# Patient Record
Sex: Female | Born: 1950 | Race: White | Hispanic: No | Marital: Single | Smoking: Current every day smoker
Health system: Southern US, Community
[De-identification: ages and names within clinical notes are randomized; demographics above are authoritative.]

## PROBLEM LIST (undated history)

## (undated) DIAGNOSIS — E039 Hypothyroidism, unspecified: Secondary | ICD-10-CM

## (undated) DIAGNOSIS — J45909 Unspecified asthma, uncomplicated: Secondary | ICD-10-CM

## (undated) HISTORY — DX: Hypothyroidism, unspecified: E03.9

## (undated) HISTORY — PX: CHOLECYSTECTOMY: SHX55

## (undated) HISTORY — DX: Unspecified asthma, uncomplicated: J45.909

## (undated) HISTORY — PX: BREAST REDUCTION SURGERY: SHX8

---

## 1999-03-29 ENCOUNTER — Other Ambulatory Visit: Admission: RE | Admit: 1999-03-29 | Discharge: 1999-03-29 | Payer: Self-pay | Admitting: Obstetrics and Gynecology

## 2000-09-29 ENCOUNTER — Ambulatory Visit (HOSPITAL_COMMUNITY): Admission: RE | Admit: 2000-09-29 | Discharge: 2000-09-29 | Payer: Self-pay | Admitting: Family Medicine

## 2000-09-29 ENCOUNTER — Encounter: Payer: Self-pay | Admitting: Family Medicine

## 2005-10-12 ENCOUNTER — Ambulatory Visit (HOSPITAL_COMMUNITY): Admission: RE | Admit: 2005-10-12 | Discharge: 2005-10-12 | Payer: Self-pay | Admitting: Family Medicine

## 2006-02-20 ENCOUNTER — Ambulatory Visit (HOSPITAL_COMMUNITY): Admission: RE | Admit: 2006-02-20 | Discharge: 2006-02-20 | Payer: Self-pay | Admitting: Family Medicine

## 2006-02-28 ENCOUNTER — Ambulatory Visit (HOSPITAL_COMMUNITY): Admission: RE | Admit: 2006-02-28 | Discharge: 2006-02-28 | Payer: Self-pay | Admitting: Family Medicine

## 2006-03-08 ENCOUNTER — Encounter: Admission: RE | Admit: 2006-03-08 | Discharge: 2006-05-12 | Payer: Self-pay | Admitting: Neurosurgery

## 2006-07-22 ENCOUNTER — Ambulatory Visit (HOSPITAL_COMMUNITY): Admission: RE | Admit: 2006-07-22 | Discharge: 2006-07-22 | Payer: Self-pay | Admitting: Interventional Radiology

## 2007-05-15 ENCOUNTER — Ambulatory Visit (HOSPITAL_COMMUNITY): Admission: RE | Admit: 2007-05-15 | Discharge: 2007-05-15 | Payer: Self-pay | Admitting: Endocrinology

## 2008-06-09 ENCOUNTER — Ambulatory Visit (HOSPITAL_COMMUNITY): Admission: RE | Admit: 2008-06-09 | Discharge: 2008-06-09 | Payer: Self-pay | Admitting: Endocrinology

## 2008-07-24 ENCOUNTER — Encounter
Admission: RE | Admit: 2008-07-24 | Discharge: 2008-07-25 | Payer: Self-pay | Admitting: Physical Medicine & Rehabilitation

## 2008-07-25 ENCOUNTER — Ambulatory Visit: Payer: Self-pay | Admitting: Physical Medicine & Rehabilitation

## 2008-08-22 ENCOUNTER — Ambulatory Visit: Payer: Self-pay | Admitting: Physical Medicine & Rehabilitation

## 2008-08-22 ENCOUNTER — Encounter
Admission: RE | Admit: 2008-08-22 | Discharge: 2008-11-20 | Payer: Self-pay | Admitting: Physical Medicine & Rehabilitation

## 2008-10-03 ENCOUNTER — Ambulatory Visit: Payer: Self-pay | Admitting: Physical Medicine & Rehabilitation

## 2008-11-04 ENCOUNTER — Ambulatory Visit: Payer: Self-pay | Admitting: Physical Medicine & Rehabilitation

## 2008-12-02 ENCOUNTER — Encounter
Admission: RE | Admit: 2008-12-02 | Discharge: 2009-01-28 | Payer: Self-pay | Admitting: Physical Medicine & Rehabilitation

## 2008-12-03 ENCOUNTER — Ambulatory Visit: Payer: Self-pay | Admitting: Physical Medicine & Rehabilitation

## 2008-12-29 ENCOUNTER — Ambulatory Visit: Payer: Self-pay | Admitting: Physical Medicine & Rehabilitation

## 2009-01-28 ENCOUNTER — Ambulatory Visit: Payer: Self-pay | Admitting: Physical Medicine & Rehabilitation

## 2009-04-23 ENCOUNTER — Ambulatory Visit (HOSPITAL_COMMUNITY): Admission: RE | Admit: 2009-04-23 | Discharge: 2009-04-23 | Payer: Self-pay | Admitting: Unknown Physician Specialty

## 2010-06-22 NOTE — Consult Note (Signed)
[name], [name]              ACCOUNT NO.:  [id number]   MEDICAL RECORD NO.:  [id number]          PATIENT TYPE:  OUT   LOCATION:  XRAY                         FACILITY:  [facility]   PHYSICIAN:  Sanjeev K. Deveshwar, M.D.DATE OF BIRTH:  1950-06-08   DATE OF CONSULTATION:  07/18/2006  DATE OF DISCHARGE:                                 CONSULTATION   ADDENDUM:  Dr. Corliss Skains also discussed other treatment options such as  epidural injections and physical therapy depending on the results of the  MRI.  He also recommended that the patient try to quit smoking.      Delton See, P.A.    ______________________________  Grand[place] Silos. Corliss Skains, M.D.    DR/MEDQ  D:  07/18/2006  T:  07/19/2006  Job:  [id number]   cc:   Dario Guardian, M.D.  Dorisann Frames, M.D.

## 2010-06-22 NOTE — Assessment & Plan Note (Signed)
A 60 year old female with lumbar pain likely facet arthropathy.  She was  last seen by me August 22, 2008.  She has been maintained on oxycodone 5  mg t.i.d.  She has been started on trazodone 50 nightly.  She is at a  new job.  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  Her average pain is 4-  5/10, interferes with activity at a 3/10.  Sleep is good.  Relief from  meds is good.  She can climb steps.  She drives.  She works 40 hours a  week at [employer].   REVIEW OF SYSTEMS:  Positive for tingling and spasms.   PHYSICAL EXAMINATION:  VITAL SIGNS:  Blood pressure 140/83, pulse 93,  respirations 18, O2 sat 99% on room air.  GENERAL:  No acute distress.  Orientation x3.  Affect is bright.  Gait  is normal.  BACK:  Has mild tenderness to palpation lumbosacral junction.  She has  pain with extension greater than with flexion.  Lower extremity strength  is normal.  Lower extremity range of motion is normal.  Deep tendon  reflexes are normal.   IMPRESSION:  Lumbar pain, likely spondylosis related.   PLAN:  We will continue on current medications.  She is doing well quite  functional with this.  She has gone through physical therapy with  aquatic therapy continues on her own at community based aquatic program.  I will see her back in about 3 months, nursing visit in 1 month.      Erick Colace, M.D.  Electronically Signed     AEK/MedQ  D:  10/03/2008 17:02:13  T:  10/04/2008 06:14:35  Job #:  [id number]

## 2010-06-22 NOTE — Consult Note (Signed)
A 60 year old female who has a history of osteoporosis had an acute L5  fracture in 2008, and prior to that, had an L1 fracture, date  undetermined.  There is a consultation I reviewed from Dr. Corliss Skains  dated July 18, 2006, stating that she fell on some ice in the early  1990s and developed back pain since that time.  That may have been her  T2 and L2 fractures on the endplates.  Her last MRI was July 22, 2006,  superior endplate deformity L5 but was really not felt to look acute and  superior endplate compression L2.  She was not scanned in the thoracic  region at that time.  More significantly, she had facet degenerative  changes greatest at right L3-4 but also on my review of the films  present at L5-S1 and L4-5 bilaterally.   Her pain interferes with activity at 5-6/10 despite that she is working  as a Engineer, civil (consulting), recently switched from the [employer] to a more desk job  at [employer] 40 hours a week.  Stable climb steps.  She is able  to drive.  Pain is worse with walking, bending, standing, improves with  rest and medications.  Relief from meds is good.   PAST HISTORY:  Hypothyroidism.  Sees endocrinologist, Dr. Talmage Nap for both  this and osteoporosis.  She has been using Forteo to boost her bone  density.   PAST SURGICAL HISTORY:  Breast reduction in 1980s; gallbladder surgery  in 1980; ovarian cyst, right side.   SOCIAL HISTORY:  Single, lives with her boyfriend, smokes a few  cigarettes per day down from a half pack per day in 2008.  Occasional  glass of wine.   FAMILY HISTORY:  Heart disease, high blood pressure.   PHYSICAL EXAMINATION:  VITAL SIGNS:  Blood pressure 153/76, pulse 98,  respirations 18 and O2 sat 94% room air.  GENERAL:  Well-developed, well-nourished female in no acute stress.  Orientation x3.  Affect alert.  Gait is normal.  EXTREMITIES:  Without edema.  Coordination normal in the upper and lower  extremities.  Deep tendon reflexes are normal in  the upper and lower  extremities.  Her lumbar spine range of motion is 75% in forward  flexion/extension, lateral rotation and bending.  She has pain more on  forward flexion at end range.   Gait is normal.  She can toe walk, heel walk.  She has mild tenderness  to palpation in lumbar paraspinal starting at L4 down to the PSIS.   IMPRESSION:  Lumbar pain.  I suspect she may have lumbar spondylosis as  a cause of her pain rather than the compression fractures which are  essentially healed.   RECOMMENDATIONS:  The lumbar medial branch blocks to further assess. We  will get these scheduled in the next couple of weeks.   Looking at her medications, she does pretty well with oxycodone.  She is  concerned about having to escalate on this at some point. She is in  favor of multimodal approach which I also have explained to her.   We will start her on some physical therapy, aquatic exercise and some  land-based therapy as well with precaution of osteoporosis. No excessive  flexion.   Thank you for this interesting consultation.  We will keep you apprised  of her progress.      Erick Colace, M.D.  Electronically Signed     AEK/MedQ  D:07/25/2008 16:48:36  T:07/26/2008 09:22:35  Job #:  [id number]   cc:   Dario Guardian, M.D.  Fax: [phone]

## 2010-06-22 NOTE — Consult Note (Signed)
NAMEAIREAL, SLATER              ACCOUNT NO.:  [id number]   MEDICAL RECORD NO.:  [id number]          PATIENT TYPE:  OUT   LOCATION:  XRAY                         FACILITY:  [facility]   PHYSICIAN:  Sanjeev K. Deveshwar, M.D.DATE OF BIRTH:  1950/09/01   DATE OF CONSULTATION:  07/18/2006  DATE OF DISCHARGE:                                 CONSULTATION   CHIEF COMPLAINT:  Back pain.   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  She was referred  to Dr. Corliss Skains through the courtesy of Dr. Talmage Nap, her endocrinologist,  who is currently treating her for osteoporosis.  The patient fell on the  ice in the early 1990s.  She developed back pain at that time.  She  recently had an increase in her back pain in January of this year.  An  MRI performed February 20, 2006, showed chronic endplate fractures at T2  and L2 with an acute or subacute endplate fracture at L5.   The patient reports that she has been having pain anywhere between a 6  on a 1-10 scale to an 8 or 9 on a 1-10 scale.  She states that at times  her pain brings her to tears despite taking Vicodin b.i.d.  The patient  is usually quite active.  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  She previously  worked out on a regular basis including lifting weights and exercises.  She has been significantly limited at this point due to her pain.  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  She  presents today to be seen by Dr. Corliss Skains for further evaluation.   PAST MEDICAL HISTORY:  1. 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  2. She has history of osteoporosis.  3. Apparently she is being worked up for an abnormal thyroid.  4. She has a history of degenerative cervical disk disease.  5. She reports an old abnormal EKG but a normal stress  test.      Otherwise she has been very healthy.   PAST SURGICAL HISTORY:  significant for cholecystectomy and a right  oophorectomy.  She reports nausea and vomiting with anesthesia.   ALLERGIES:  Include ERYTHROMYCIN and CONTRAST.  She developed a rash and  chest heaviness with CONTRAST DYE in the past,  although she had no  problems with contrast for a CT scan.   CURRENT MEDICATIONS:  Vicodin b.i.d. Bio-identical hormones, vitamin D,  calcium, Forteo, and estriol and estradiol hormone replacement therapy.   SOCIAL HISTORY:  The patient is divorced.  She has no children.  [place]  lives [place]  She has a 10-year history of  tobacco use, smoking up to one pack per day.  She drinks an occasional  glass of wine but this is rare.  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   FAMILY HISTORY:  Her mother is alive at age 60, she has hypertension.  Her father died at age 65, the exact cause is unknown.   IMPRESSION AND PLAN:  As noted, the patient presents today for further  evaluation of back pain.  Dr. Corliss Skains reviewed the results of her MRI  performed in January of this year.  He noted the acute compression  fracture at the L5 level as well as the chronic healed compression  fracture at L2.  The MRI only included the lumbar sacral spine.  Due to  the severity of the pain, Dr. Corliss Skains felt that a repeat MRI was  indicated as this MRI was about six months old.  The decision has been  made to proceed with a T8-S1 MRI with contrast for further evaluation of  her symptoms.  As noted, the patient also has degenerative disk disease.  Dr. Corliss Skains also felt that there was a possibility of a facette  problem causing her pain as well.   The kyphoplasty and vertebroplasty procedures were described in detail  along with the risks and benefits. The patient would like to proceed   with the kyphoplasty if felt to be indicated in her situation.  We will  know more once we have the results of the MRI.  If she requires a  kyphoplasty, we will give her a 13-hour prednisone prophylaxis due to  the possibility of balloon rupture and exposure to contrast dye.  She  was given a prescription for the prednisone today.  We will await the  results of the MRI prior to making any further recommendations.   Greater than 40 minutes was spent on this consult.      Delton See, P.A.    ______________________________  Grandville Silos. Corliss Skains, M.D.    DR/MEDQ  D:  07/18/2006  T:  07/19/2006  Job:  [id number]   cc:   Dario Guardian, M.D.

## 2010-06-22 NOTE — Assessment & Plan Note (Signed)
Molly Myers is a 60 year old female originally scheduled for medial branch  block.  She has axial back pain.  She has a healed compression deformity  due to osteoporosis.  She does have facet degenerative changes, greatest  on L3-4, but also present at L4-5 and L5-S1.  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   Her insurance has changed and states that she has what is known as a  Water quality scientist.  She has a 4000 dollar deductible.   Average pain is 5-6/10, interferes with activity at 3-4/10 level.  Sleep  is poor.  Pain improves with therapy medications.  Relief from meds is  fair.  She can walk without assistance.  Climb steps.  She drives.  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   REVIEW OF SYSTEMS:  Tingling and spasms.   PHYSICAL EXAMINATION:  VITAL SIGNS:  Blood pressure 139/81, pulse 90,  respirations 16, and O2 sat 98% on room air.  GENERAL:  Well-developed and well-nourished female in no acute stress.  Orientation x3.  Affect is bright and alert.  Gait is normal.  EXTREMITIES:  Without edema.  Sensation is normal.  She has normal  strength in lower extremities.  Negative straight leg raising test.  She  has some pain in the low back with extension.   IMPRESSION:  1. Lumbar pain.  I believe this is mainly facet arthropathy.  She is      doing relatively well on oxycodone 5 mg t.i.d.  No signs of      aberrant drug behavior.  2. Sleep disturbance.  We will start trazodone 50 nightly.  We will      stay away from benzodiazepines.  3. We will monitor with urine drug screen.  See her back in about 2      months and nursing visit in 1 month.      Erick Colace, M.D.  Electronically Signed     AEK/MedQ  D:  08/22/2008 16:49:20  T:  08/23/2008 03:29:55  Job #:  [id number]

## 2010-08-04 ENCOUNTER — Other Ambulatory Visit (HOSPITAL_COMMUNITY): Payer: Self-pay | Admitting: Endocrinology

## 2010-08-05 ENCOUNTER — Other Ambulatory Visit (HOSPITAL_COMMUNITY): Payer: Self-pay | Admitting: Pain Medicine

## 2010-08-05 DIAGNOSIS — M542 Cervicalgia: Secondary | ICD-10-CM

## 2010-08-05 DIAGNOSIS — M47812 Spondylosis without myelopathy or radiculopathy, cervical region: Secondary | ICD-10-CM

## 2010-08-27 ENCOUNTER — Ambulatory Visit (HOSPITAL_COMMUNITY)
Admission: RE | Admit: 2010-08-27 | Discharge: 2010-08-27 | Disposition: A | Discharge status: Home / Self Care | Payer: 59 | Source: Ambulatory Visit | Attending: Pain Medicine | Admitting: Pain Medicine

## 2010-08-27 DIAGNOSIS — M542 Cervicalgia: Secondary | ICD-10-CM | POA: Insufficient documentation

## 2010-08-27 DIAGNOSIS — M47812 Spondylosis without myelopathy or radiculopathy, cervical region: Secondary | ICD-10-CM

## 2010-08-27 DIAGNOSIS — M5020 Other cervical disc displacement, unspecified cervical region: Secondary | ICD-10-CM | POA: Insufficient documentation

## 2010-08-27 DIAGNOSIS — G589 Mononeuropathy, unspecified: Secondary | ICD-10-CM | POA: Insufficient documentation

## 2010-08-27 DIAGNOSIS — M79609 Pain in unspecified limb: Secondary | ICD-10-CM | POA: Insufficient documentation

## 2010-09-03 ENCOUNTER — Ambulatory Visit (HOSPITAL_COMMUNITY)
Admission: RE | Admit: 2010-09-03 | Discharge: 2010-09-03 | Disposition: A | Discharge status: Home / Self Care | Payer: 59 | Source: Ambulatory Visit | Attending: Endocrinology | Admitting: Endocrinology

## 2010-09-03 ENCOUNTER — Other Ambulatory Visit (HOSPITAL_COMMUNITY): Payer: Self-pay | Admitting: Endocrinology

## 2010-09-03 DIAGNOSIS — M810 Age-related osteoporosis without current pathological fracture: Secondary | ICD-10-CM | POA: Insufficient documentation

## 2014-05-19 ENCOUNTER — Other Ambulatory Visit: Payer: Self-pay | Admitting: Endocrinology

## 2014-05-19 DIAGNOSIS — M810 Age-related osteoporosis without current pathological fracture: Secondary | ICD-10-CM

## 2014-05-22 ENCOUNTER — Other Ambulatory Visit: Payer: Self-pay

## 2015-02-26 ENCOUNTER — Other Ambulatory Visit: Payer: Self-pay | Admitting: Obstetrics and Gynecology

## 2015-02-26 DIAGNOSIS — R928 Other abnormal and inconclusive findings on diagnostic imaging of breast: Secondary | ICD-10-CM

## 2015-03-03 ENCOUNTER — Institutional Professional Consult (permissible substitution): Payer: Self-pay | Admitting: Pulmonary Disease

## 2015-03-06 ENCOUNTER — Ambulatory Visit
Admission: RE | Admit: 2015-03-06 | Discharge: 2015-03-06 | Disposition: A | Discharge status: Home / Self Care | Payer: 59 | Source: Ambulatory Visit | Attending: Obstetrics and Gynecology | Admitting: Obstetrics and Gynecology

## 2015-03-06 DIAGNOSIS — R928 Other abnormal and inconclusive findings on diagnostic imaging of breast: Secondary | ICD-10-CM

## 2015-04-20 ENCOUNTER — Ambulatory Visit (INDEPENDENT_AMBULATORY_CARE_PROVIDER_SITE_OTHER): Payer: 59 | Admitting: Pulmonary Disease

## 2015-04-20 ENCOUNTER — Other Ambulatory Visit (INDEPENDENT_AMBULATORY_CARE_PROVIDER_SITE_OTHER): Payer: 59

## 2015-04-20 ENCOUNTER — Encounter: Payer: Self-pay | Admitting: Pulmonary Disease

## 2015-04-20 VITALS — BP 128/82 | HR 91 | Ht 63.0 in | Wt 148.0 lb

## 2015-04-20 DIAGNOSIS — J4541 Moderate persistent asthma with (acute) exacerbation: Secondary | ICD-10-CM

## 2015-04-20 LAB — CBC WITH DIFFERENTIAL/PLATELET
BASOS ABS: 0.1 10*3/uL (ref 0.0–0.1)
Basophils Relative: 1 % (ref 0.0–3.0)
EOS ABS: 0.6 10*3/uL (ref 0.0–0.7)
Eosinophils Relative: 5.5 % — ABNORMAL HIGH (ref 0.0–5.0)
HEMATOCRIT: 45.9 % (ref 36.0–46.0)
HEMOGLOBIN: 15.9 g/dL — AB (ref 12.0–15.0)
LYMPHS PCT: 34.1 % (ref 12.0–46.0)
Lymphs Abs: 3.5 10*3/uL (ref 0.7–4.0)
MCHC: 34.6 g/dL (ref 30.0–36.0)
MCV: 86.4 fl (ref 78.0–100.0)
Monocytes Absolute: 0.7 10*3/uL (ref 0.1–1.0)
Monocytes Relative: 7.1 % (ref 3.0–12.0)
Neutro Abs: 5.4 10*3/uL (ref 1.4–7.7)
Neutrophils Relative %: 52.3 % (ref 43.0–77.0)
Platelets: 287 10*3/uL (ref 150.0–400.0)
RBC: 5.32 Mil/uL — AB (ref 3.87–5.11)
RDW: 13.4 % (ref 11.5–15.5)
WBC: 10.3 10*3/uL (ref 4.0–10.5)

## 2015-04-20 MED ORDER — FLUTICASONE-SALMETEROL 250-50 MCG/DOSE IN AEPB: 1.0000 | INHALATION_SPRAY | Freq: Two times a day (BID) | RESPIRATORY_TRACT | Status: DC

## 2015-04-20 NOTE — Patient Instructions (Addendum)
You will be started on Advair 250/50. You will be scheduled for lung function tests and blood work to check blood count with differential, IgE levels.  Return to clinic in 3 months.

## 2015-04-20 NOTE — Progress Notes (Signed)
Subjective:    Patient ID: Molly Myers, female    DOB: 10/18/1950, 65 y.o.   MRN: [id number]  HPI Consult for evaluation, management of asthma.  [name] is a 65 year old with past medical history as below. She has a diagnosis of asthma and has had multiple episodes over the past few years. She usually goes to urgent care and was given prednisone taper, nebs. She last had an eval in January. She had a chest x-ray at urgent care which is reportedly normal as per the patient. She is maintained on just albuterol rescue inhaler and nebulizer. She needs to use this up to 4 times a day. She also wakes up daily at night short of breath. She's not been on any long-term controller medication. She also has seasonal allergies, sensitivity to pollen. This is worse in spring. She is on a Flonase nasal spray. She does not have any sensitivity to cats, dogs, dust.  DATA: PFTs 01/06/15 FVC 1.87 (75%) FEV1 1.32 (63%) F/F 70 Moderate obstruction.  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 She smokes about half pack a. No alcohol, drug use.  Family history: Asthma-brother, father. Hypertension-mother  Past Medical History  Diagnosis Date  . Hypothyroidism   . Asthma      Current outpatient prescriptions:  .  albuterol (PROVENTIL HFA;VENTOLIN HFA) 108 (90 Base) MCG/ACT inhaler, Inhale 1-2 puffs into the lungs every 6 (six) hours as needed for wheezing or shortness of breath., Disp: , Rfl:  .  albuterol (PROVENTIL) (2.5 MG/3ML) 0.083% nebulizer solution, 1 vial in nebulizer every 6 hours as needed, Disp: , Rfl: 1 .  DHEA 10 MG TABS, Take 10 mg by mouth daily., Disp: , Rfl:  .  OPANA ER, CRUSH RESISTANT, 10 MG T12A 12 hr tablet, Take 10 mg by mouth every 12 (twelve) hours. For back pain, Disp: , Rfl: 0 .  Oxycodone HCl 10 MG TABS, Take 10 mg by mouth 4 (four) times daily as needed. For back pain, Disp: , Rfl: 0 .  thyroid (ARMOUR) 60 MG tablet, Take 60 mg by  mouth daily before breakfast., Disp: , Rfl:  .  UNABLE TO FIND, Med Name: hormone cream, Disp: , Rfl:   Review of Systems Dyspnea, wheezing. No cough, sputum production, hemoptysis. No chest pain, palpitation. No nausea, vomiting, diarrhea, constipation. No fevers, chills, loss of weight, loss of appetite. All other review of systems are negative.    Objective:   Physical Exam Blood pressure 128/82, pulse 91, height 5\' 3"  (1.6 m), weight 148 lb (67.132 kg), SpO2 93 %. Gen: No apparent distress Neuro: No gross focal deficits. Neck: No JVD, lymphadenopathy, thyromegaly. RS: Exp wheeze, no crackles CVS: S1-S2 heard, no murmurs rubs gallops. Abdomen: Soft, positive bowel sounds. Extremities: No edema.    Assessment & Plan:  Moderate persistent asthma.  Her symptoms are not well controlled. She is not on any long-term controller medication. She is only using albuterol which she needs 4 times a day. She has symptoms of allergic rhinitis, postnasal drip which is probably contributing to symptoms.  I'll start her on Advair 250/50. She will get scheduled for lung function tests and blood count with differential to evaluate for eosinophila, IgE levels.  Plan: - Advair 250/50 - Check CBC with diff, IgE - PFTs  Return to clinic in 3 months.  Chilton GreathousePraveen Kristl Morioka MD [facility] Pager [phone] If no answer or after 3pm  call: [phone] 04/20/2015, 11:17 AM

## 2015-04-21 LAB — IGE: IgE (Immunoglobulin E), Serum: 44 kU/L (ref <115)

## 2015-04-29 ENCOUNTER — Encounter: Payer: Self-pay | Admitting: Pulmonary Disease

## 2015-06-15 ENCOUNTER — Telehealth: Payer: Self-pay | Admitting: *Deleted

## 2015-06-15 NOTE — Telephone Encounter (Signed)
Received mail from [facility] stating that starting June 1 pt's Advair 250-550mcg will no longer be longer and the covered alternatives are: Arnuity, Flovent, QVAR, Perforomist.  Per PM: please switch to Arnuity Ellipta and call pt to let her know.  Called spoke with patient who is very upset with her insurance company and has done wonderfully with the Adviar and is "terrified to switch" Advised pt will speak with PM tomorrow about her possibly trying a sample(s) to see if this works well for her and will her  Will route back to me

## 2015-06-16 NOTE — Telephone Encounter (Signed)
Discussed with PM: okay for pt to try Arnuity for a couple of weeks to see how this works for her.  Understandable that she is concerned about changing inhalers if the Advair is working so well for her.  Please ask her to let us know how it works for her.  Thanks.  LMOM TCB x1 for pt No samples obtained yet, until she can be made aware.  She will need to come to the office for instruction.

## 2015-06-19 NOTE — Telephone Encounter (Signed)
Called spoke with patient - she will come by Monday to pick up samples.  2 samples left up front for pt, she is aware she will need to be shown how to use these and to call back with an update on the Arnuity.  Med list updated Will sign off

## 2015-07-02 ENCOUNTER — Other Ambulatory Visit: Payer: Self-pay | Admitting: Internal Medicine

## 2015-07-02 DIAGNOSIS — B1920 Unspecified viral hepatitis C without hepatic coma: Secondary | ICD-10-CM

## 2015-07-15 ENCOUNTER — Ambulatory Visit
Admission: RE | Admit: 2015-07-15 | Discharge: 2015-07-15 | Disposition: A | Discharge status: Home / Self Care | Payer: 59 | Source: Ambulatory Visit | Attending: Internal Medicine | Admitting: Internal Medicine

## 2015-07-15 DIAGNOSIS — B1920 Unspecified viral hepatitis C without hepatic coma: Secondary | ICD-10-CM

## 2015-07-24 ENCOUNTER — Encounter: Payer: Self-pay | Admitting: Pulmonary Disease

## 2015-07-24 ENCOUNTER — Ambulatory Visit (INDEPENDENT_AMBULATORY_CARE_PROVIDER_SITE_OTHER): Payer: 59 | Admitting: Pulmonary Disease

## 2015-07-24 VITALS — BP 134/76 | HR 95 | Ht 63.5 in | Wt 144.0 lb

## 2015-07-24 DIAGNOSIS — J4530 Mild persistent asthma, uncomplicated: Secondary | ICD-10-CM

## 2015-07-24 DIAGNOSIS — J4541 Moderate persistent asthma with (acute) exacerbation: Secondary | ICD-10-CM

## 2015-07-24 DIAGNOSIS — J45909 Unspecified asthma, uncomplicated: Secondary | ICD-10-CM | POA: Insufficient documentation

## 2015-07-24 LAB — PULMONARY FUNCTION TEST
DL/VA % PRED: 96 %
DL/VA: 4.55 ml/min/mmHg/L
DLCO COR % PRED: 84 %
DLCO UNC % PRED: 83 %
DLCO UNC: 19.72 ml/min/mmHg
DLCO cor: 19.84 ml/min/mmHg
FEF 25-75 POST: 1.83 L/s
FEF 25-75 PRE: 1.17 L/s
FEF2575-%CHANGE-POST: 55 %
FEF2575-%PRED-POST: 89 %
FEF2575-%Pred-Pre: 57 %
FEV1-%CHANGE-POST: 8 %
FEV1-%PRED-POST: 81 %
FEV1-%Pred-Pre: 75 %
FEV1-Post: 1.9 L
FEV1-Pre: 1.75 L
FEV1FVC-%CHANGE-POST: 3 %
FEV1FVC-%PRED-PRE: 95 %
FEV6-%CHANGE-POST: 6 %
FEV6-%PRED-POST: 85 %
FEV6-%Pred-Pre: 80 %
FEV6-PRE: 2.37 L
FEV6-Post: 2.51 L
FEV6FVC-%CHANGE-POST: 1 %
FEV6FVC-%PRED-PRE: 103 %
FEV6FVC-%Pred-Post: 104 %
FVC-%Change-Post: 4 %
FVC-%Pred-Post: 82 %
FVC-%Pred-Pre: 78 %
FVC-Post: 2.51 L
FVC-Pre: 2.4 L
POST FEV1/FVC RATIO: 76 %
PRE FEV6/FVC RATIO: 99 %
Post FEV6/FVC ratio: 100 %
Pre FEV1/FVC ratio: 73 %
RV % pred: 103 %
RV: 2.16 L
TLC % pred: 92 %
TLC: 4.62 L

## 2015-07-24 NOTE — Progress Notes (Signed)
History of Present Illness Molly Myers is a 65 y.o. female with history of asthma  And seasonal allergies.She is followed by Dr. Isaiah Myers.   6/16/2017Follow up OV  Pt returns to the office today for PFT's . She has done well on Advair , and due to [employer], she has tried Nature conservation officer, both of which have been a therapeutic failure. She states that she has increased dysonea and breakthrough cough and wheeze and need for increased use of rescue medication. She is using her flonase for seasonal allergies. She denied fever, chest pain, orthopnea, hemoptysis, leg or calf pain.   Tests DATA: PFTs 01/06/15 FVC 1.87 (75%) FEV1 1.32 (63%) F/F 70 Moderate obstruction.  PFT's 07/24/2015 FVC: 2.40 ( 78%) FEV1: 1.75 ( 75%) F/F : 73 TLC: 4.62 ( 92%) DLCO : 83%  Eosinophil Count 5.5 Absolute Neutrophils: 567  Past medical hx Past Medical History  Diagnosis Date  . Hypothyroidism   . Asthma      Past surgical hx, Family hx, Social hx all reviewed.  Current Outpatient Prescriptions on File Prior to Visit  Medication Sig  . albuterol (PROVENTIL HFA;VENTOLIN HFA) 108 (90 Base) MCG/ACT inhaler Inhale 1-2 puffs into the lungs every 6 (six) hours as needed for wheezing or shortness of breath.  Marland Kitchen albuterol (PROVENTIL) (2.5 MG/3ML) 0.083% nebulizer solution 1 vial in nebulizer every 6 hours as needed  . DHEA 10 MG TABS Take 10 mg by mouth daily.  . OPANA ER, CRUSH RESISTANT, 10 MG T12A 12 hr tablet Take 10 mg by mouth every 12 (twelve) hours. For back pain  . Oxycodone HCl 10 MG TABS Take 10 mg by mouth 4 (four) times daily as needed. For back pain  . thyroid (ARMOUR) 60 MG tablet Take 60 mg by mouth daily before breakfast.  . UNABLE TO FIND Med Name: hormone cream  . Fluticasone Furoate (ARNUITY ELLIPTA) 200 MCG/ACT AEPB Inhale 1 puff into the lungs daily. Reported on 07/24/2015  . Fluticasone-Salmeterol (ADVAIR) 250-50 MCG/DOSE AEPB Inhale 1 puff into the lungs every 12  (twelve) hours. (Patient not taking: Reported on 07/24/2015)   No current facility-administered medications on file prior to visit.     Allergies  Allergen Reactions  . Erythromycin Nausea And Vomiting    Review Of Systems:  Constitutional:   No  weight loss, night sweats,  Fevers, chills, fatigue, or  lassitude.  HEENT:   No headaches,  Difficulty swallowing,  Tooth/dental problems, or  Sore throat,                No sneezing, itching, ear ache, ++nasal congestion,++ post nasal drip,   CV:  No chest pain,  Orthopnea, PND, swelling in lower extremities, anasarca, dizziness, palpitations, syncope.   GI  + heartburn, indigestion, abdominal pain, nausea, vomiting, diarrhea, change in bowel habits, loss of appetite, bloody stools.   Resp: + shortness of breath with exertion and  at rest.  + excess mucus, - productive cough,  + No non-productive cough,  No coughing up of blood.  No change in color of mucus.  + wheezing.  No chest wall deformity  Skin: no rash or lesions.  GU: no dysuria, change in color of urine, no urgency or frequency.  No flank pain, no hematuria   MS:  No joint pain or swelling.  No decreased range of motion.  No back pain.  Psych:  No change in mood or affect. No depression or anxiety.  No memory loss.  Vital Signs BP 134/76 mmHg  Pulse 95  Ht 5' 3.5" (1.613 m)  Wt 144 lb (65.318 kg)  BMI 25.11 kg/m2  SpO2 95%   Physical Exam:  General- No distress,  A&Ox3, pleasant ENT: No sinus tenderness, TM clear, pale nasal mucosa, no oral exudate,+ post nasal drip, no LAN Cardiac: S1, S2, regular rate and rhythm, no murmur Chest: + Exp  wheeze/ rales/ dullness; no accessory muscle use, no nasal flaring, no sternal retractions Abd.: Soft Non-tender Ext: No clubbing cyanosis, edema Neuro:  normal strength Skin: No rashes, warm and dry Psych: normal mood and behavior   Assessment/Plan  Asthma Asthma well controlled on Advair, but insurance not  covering. Has failed Breo and Arnuity Absolute Neutrophil Count is >250 ( 567)  Eosinophils 5.5   We will complete pre certification for Advair as you have failed both Breo and Arnuity trials. Rinse your mouth after use. We will send you home with a sample. Add Zantac  150 for heartburn as needed. Your PFT's show some improvement over the last set done in November. Follow up in 6 months with Molly Myers Please contact office for sooner follow up if symptoms do not improve or worsen or seek emergency Myers     Molly NgoSarah F Groce, NP 07/24/2015  2:59 PM   Attending note: I have seen and examined the patient with nurse practitioner/resident and agree with the note. History, labs and imaging reviewed.  65 Y/O with asthma. She did well on advair but had to go off due to insurance issues She worsened on Breo and arnuity We will do a precert to get advair.  Labs reviewed. She has eosinophilia and will be a candidate for anti IL5 agents if symptoms worsen.  Molly GreathousePraveen Mannam MD [facility] Pager [phone] If no answer or after 3pm call: [phone] 07/24/2015, 5:26 PM

## 2015-07-24 NOTE — Assessment & Plan Note (Signed)
Asthma well controlled on Advair, but insurance not covering. Has failed Breo and Arnuity Absolute Neutrophil Count is >250 ( 567)  Eosinophils 5.5   We will complete pre certification for Advair as you have failed both Breo and Arnuity trials. Rinse your mouth after use. We will send you home with a sample. Add Zantac  150 for heartburn as needed. Your PFT's show some improvement over the last set done in November. Follow up in 6 months with Dr. Isaiah SergeMannam Please contact office for sooner follow up if symptoms do not improve or worsen or seek emergency care

## 2015-07-24 NOTE — Patient Instructions (Addendum)
We will complete pre certification for Advair as you have failed both Breo and Arnuity trials. Rinse your mouth after use. We will send you home with a sample. Add Zantac  150 for heartburn as needed. Your PFT's show some improvement over the last set done in November. Follow up in 6 months with Dr. Isaiah SergeMannam Please contact office for sooner follow up if symptoms do not improve or worsen or seek emergency care

## 2015-07-24 NOTE — Progress Notes (Signed)
PFT done today. 

## 2015-09-08 ENCOUNTER — Other Ambulatory Visit: Payer: Self-pay | Admitting: Pulmonary Disease

## 2015-09-11 ENCOUNTER — Telehealth: Payer: Self-pay | Admitting: Pulmonary Disease

## 2015-09-11 NOTE — Telephone Encounter (Addendum)
Received proper form from [facility] for Advair PA. Form has been filled out to the best of my ability. It has been placed in PM's look at to be signed. Will route message to [facility] for follow up.

## 2015-09-16 NOTE — Telephone Encounter (Signed)
Jess, have you received this form?  Please advise.

## 2015-09-21 NOTE — Telephone Encounter (Signed)
Shanda BumpsJessica please advise if this has been followed up on. Thanks.

## 2015-09-24 NOTE — Telephone Encounter (Signed)
Form was signed by PM on 8.15.17 and faxed back to plan on that same date No approval/denial received as of yet.  Will continue to await decision

## 2015-09-24 NOTE — Telephone Encounter (Signed)
PA has been denied. States that the diagnosis provided was not sufficient enough.  PM - please advise. Thanks.  *If appeal is needed all information should be faxed to [phone]*

## 2015-09-26 NOTE — Telephone Encounter (Signed)
Lets go ahead with appeal. What info is needed? Give her samples of advair in meantime

## 2015-09-29 NOTE — Telephone Encounter (Signed)
Yes. Go ahead. Thanks.

## 2015-09-29 NOTE — Telephone Encounter (Signed)
We may send a letter from Dr. Isaiah SergeMannam describing why this medicine is necessary, clinical notes, test results or any other supporting documentation. This can be faxed to [phone] attn: Prescription Claim Appeals [facility]  Please advise Dr. Isaiah SergeMannam thanks

## 2015-09-29 NOTE — Telephone Encounter (Signed)
Letter of medical necessity typed and printed, which was faxed along with last office note to below stated fax #.  Will forward to [place] to follow up on.

## 2015-10-08 NOTE — Telephone Encounter (Signed)
PA approved for Advair from 09/30/2015-09/29/2016 LM for pt, will await call back.

## 2015-10-19 NOTE — Telephone Encounter (Signed)
Fax received from [facility] Advair has been approved from 8.23.17 through 8.23.20 Nothing further needed; will sign off

## 2016-05-19 ENCOUNTER — Other Ambulatory Visit: Payer: Self-pay | Admitting: Pulmonary Disease

## 2016-06-03 ENCOUNTER — Ambulatory Visit
Admission: RE | Admit: 2016-06-03 | Discharge: 2016-06-03 | Disposition: A | Discharge status: Home / Self Care | Payer: 59 | Source: Ambulatory Visit | Attending: Physician Assistant | Admitting: Physician Assistant

## 2016-06-03 ENCOUNTER — Other Ambulatory Visit: Payer: Self-pay | Admitting: Physician Assistant

## 2016-06-03 DIAGNOSIS — M5489 Other dorsalgia: Secondary | ICD-10-CM

## 2016-06-06 ENCOUNTER — Other Ambulatory Visit: Payer: Self-pay | Admitting: Pain Medicine

## 2016-06-06 DIAGNOSIS — M545 Low back pain, unspecified: Secondary | ICD-10-CM

## 2016-06-16 ENCOUNTER — Other Ambulatory Visit: Payer: Self-pay | Admitting: Endocrinology

## 2016-06-17 ENCOUNTER — Ambulatory Visit
Admission: RE | Admit: 2016-06-17 | Discharge: 2016-06-17 | Disposition: A | Discharge status: Home / Self Care | Payer: 59 | Source: Ambulatory Visit | Attending: Pain Medicine | Admitting: Pain Medicine

## 2016-06-17 ENCOUNTER — Other Ambulatory Visit: Payer: Self-pay | Admitting: Acute Care

## 2016-06-17 DIAGNOSIS — M545 Low back pain, unspecified: Secondary | ICD-10-CM

## 2016-06-17 DIAGNOSIS — F17210 Nicotine dependence, cigarettes, uncomplicated: Principal | ICD-10-CM

## 2016-07-01 ENCOUNTER — Inpatient Hospital Stay: Admission: RE | Admit: 2016-07-01 | Payer: 59 | Source: Ambulatory Visit

## 2016-07-01 ENCOUNTER — Encounter: Payer: 59 | Admitting: Acute Care

## 2016-07-18 ENCOUNTER — Telehealth: Payer: Self-pay | Admitting: Pulmonary Disease

## 2016-07-18 ENCOUNTER — Other Ambulatory Visit: Payer: Self-pay

## 2016-07-18 MED ORDER — FLUTICASONE-SALMETEROL 250-50 MCG/DOSE IN AEPB: INHALATION_SPRAY | RESPIRATORY_TRACT | 0 refills | Status: DC

## 2016-07-18 NOTE — Telephone Encounter (Signed)
Received refilled request for advair 250 90 supply. Last OV 07/24/15 with no pending apt. 30 day supply has been sent with a note to pharmacist that pt needs apt for further refills.  Nothing further needed.

## 2016-07-18 NOTE — Telephone Encounter (Signed)
Spoke with pt. She is needing a refill on Advair for a 90 day supply. Pt has a pending OV on 07/20/16. Rx has been sent in. Nothing further was needed.

## 2016-07-20 ENCOUNTER — Encounter: Payer: Self-pay | Admitting: Pulmonary Disease

## 2016-07-20 ENCOUNTER — Ambulatory Visit (INDEPENDENT_AMBULATORY_CARE_PROVIDER_SITE_OTHER): Payer: 59 | Admitting: Pulmonary Disease

## 2016-07-20 VITALS — BP 130/72 | HR 97 | Ht 63.0 in | Wt 132.8 lb

## 2016-07-20 DIAGNOSIS — R0602 Shortness of breath: Secondary | ICD-10-CM

## 2016-07-20 LAB — NITRIC OXIDE: NITRIC OXIDE: 5

## 2016-07-20 NOTE — Patient Instructions (Signed)
Reduce advair to once daily and montior symptoms.  If you feel well then we can stop the advair altogether.  Return in 1 year.

## 2016-07-21 NOTE — Progress Notes (Signed)
Molly Myers    [id number]    1950-08-22  Primary Care Physician:Pharr, Zollie Beckers, MD  Referring Physician: Merri Brunette, MD [address] [place], [place] [address]  Chief complaint:   Follow up for Mild persistent asthma  HPI: Molly Myers is a 66 year old with past medical history as below. Molly Myers has a diagnosis of asthma and has had multiple episodes over the past few years. Molly Myers usually goes to urgent care and was given prednisone taper, nebs. Molly Myers last had an eval in January. Molly Myers had a chest x-ray at urgent care which is reportedly normal as per the patient. Molly Myers is maintained on just albuterol rescue inhaler and nebulizer. Molly Myers needs to use this up to 4 times a day. Molly Myers also wakes up daily at night short of breath. Molly Myers's not been on any long-term controller medication. Molly Myers also has seasonal allergies, sensitivity to pollen. This is worse in spring. Molly Myers is on a Flonase nasal spray. Molly Myers does not have any sensitivity to cats, dogs, dust.  Interim History: Molly Myers is doing well on Advair. Molly Myers hardly needs to use her rescue inhaler. There have not been any exacerbations or ER visits since her last clinic visit.  Outpatient Encounter Prescriptions as of 07/20/2016  Medication Sig  . albuterol (PROVENTIL HFA;VENTOLIN HFA) 108 (90 Base) MCG/ACT inhaler Inhale 1-2 puffs into the lungs every 6 (six) hours as needed for wheezing or shortness of breath.  Marland Kitchen albuterol (PROVENTIL) (2.5 MG/3ML) 0.083% nebulizer solution 1 vial in nebulizer every 6 hours as needed  . DHEA 10 MG TABS Take 10 mg by mouth daily.  . Fluticasone-Salmeterol (ADVAIR DISKUS) 250-50 MCG/DOSE AEPB INHALE 1 PUFF INTO THE LUNGS EVERY 12 HOURS  . metaxalone (SKELAXIN) 800 MG tablet Take 800 mg by mouth every 8 (eight) hours as needed.  . Oxycodone HCl 10 MG TABS Take 10 mg by mouth 4 (four) times daily as needed. For back pain  . thyroid (ARMOUR) 60 MG tablet Take 60 mg by mouth daily before breakfast.  . UNABLE TO  FIND Med Name: hormone cream  . XTAMPZA ER 27 MG C12A TAKE ONE CAPSULE EVERY 12 HOURS  . [DISCONTINUED] BREO ELLIPTA 200-25 MCG/INH AEPB Inhale 1 puff into the lungs daily.  . [DISCONTINUED] Fluticasone Furoate (ARNUITY ELLIPTA) 200 MCG/ACT AEPB Inhale 1 puff into the lungs daily. Reported on 07/24/2015  . [DISCONTINUED] LINZESS 145 MCG CAPS capsule Take 145 mcg by mouth daily.  . [DISCONTINUED] OPANA ER, CRUSH RESISTANT, 10 MG T12A 12 hr tablet Take 10 mg by mouth every 12 (twelve) hours. For back pain   No facility-administered encounter medications on file as of 07/20/2016.     Allergies as of 07/20/2016 - Review Complete 07/20/2016  Allergen Reaction Noted  . Erythromycin Nausea And Vomiting 04/20/2015    Past Medical History:  Diagnosis Date  . Asthma   . Hypothyroidism     Past Surgical History:  Procedure Laterality Date  . BREAST REDUCTION SURGERY Bilateral benign tumors  . CHOLECYSTECTOMY  in the 53s    Family History  Problem Relation Age of Onset  . Asthma Brother        childhood  . Asthma Father        adult onset  . Hypertension Maternal Aunt   . Congestive Heart Failure Maternal Grandfather   . Hypertension Mother   . Congestive Heart Failure Maternal Aunt     Social History   Social History  . Marital  status: Single    Spouse name: N/A  . Number of children: N/A  . Years of education: N/A   Occupational History  . Not on file.   Social History Main Topics  . Smoking status: Current Every Day Smoker    Packs/day: 0.50    Years: 20.00    Types: Cigarettes  . Smokeless tobacco: Never Used     Comment: typically smokes between 1/4 and 1/2 ppd (04/20/15)  . Alcohol use No  . Drug use: No  . Sexual activity: Not on file   Other Topics Concern  . 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  HENT: Negative.   Eyes: Negative for blurred vision.  Respiratory: as per HPI  Cardiovascular: Negative for chest pain and palpitations.  Gastrointestinal: Negative for vomiting, diarrhea, blood per rectum. Genitourinary: Negative for dysuria, urgency, frequency and hematuria.  Musculoskeletal: Negative for myalgias, back pain and joint pain.  Skin: Negative for itching and rash.  Neurological: Negative for dizziness, tremors, focal weakness, seizures and loss of consciousness.  Endo/Heme/Allergies: Negative for environmental allergies.  Psychiatric/Behavioral: Negative for depression, suicidal ideas and hallucinations.  All other systems reviewed and are negative.  Physical Exam: Blood pressure 130/72, pulse 97, height 5\' 3"  (1.6 m), weight 132 lb 12.8 oz (60.2 kg), SpO2 95 %. Gen:      No acute distress HEENT:  EOMI, sclera anicteric Neck:     No masses; no thyromegaly Lungs:    Clear to auscultation bilaterally; normal respiratory effort CV:         Regular rate and rhythm; no murmurs Abd:      + bowel sounds; soft, non-tender; no palpable masses, no distension Ext:    No edema; adequate peripheral perfusion Skin:      Warm and dry; no rash Neuro: alert and oriented x 3 Psych: normal mood and affect  Data Reviewed: PFTs 01/06/15 FVC 1.87 (75%) FEV1 1.32 (63%) F/F 70 Moderate obstruction.  PFTs 07/24/15 FVC 2.52 (82%] FEV1 1.90 [81%) F/F 76 TLC 92% DLCO 83% Minimal obstructive airway disease.  FENO 07/21/16- 5  Assessment:  Mild persistent asthma Symptoms appear to be well controlled on Advair. FENO is low. I am not sure if Molly Myers needs to continue on the controller medications We will reduce Advair to once daily and monitor symptoms If Molly Myers continues to do well on this we may be able to stop altogether and use just albuterol PRN  Plan/Recommendations: - Reduce advair to  once daily. Continue albuterol as needed  Chilton GreathousePraveen Kohner Orlick MD [facility] Pager [phone] 07/21/2016, 2:32 PM  CC: Merri BrunettePharr, Walter, MD

## 2016-10-15 ENCOUNTER — Other Ambulatory Visit: Payer: Self-pay | Admitting: Pulmonary Disease

## 2016-12-12 ENCOUNTER — Other Ambulatory Visit (HOSPITAL_COMMUNITY): Payer: Self-pay | Admitting: Endocrinology

## 2016-12-12 DIAGNOSIS — E0590 Thyrotoxicosis, unspecified without thyrotoxic crisis or storm: Secondary | ICD-10-CM

## 2016-12-22 ENCOUNTER — Encounter (HOSPITAL_COMMUNITY): Payer: 59

## 2016-12-23 ENCOUNTER — Encounter (HOSPITAL_COMMUNITY): Payer: 59

## 2017-01-23 ENCOUNTER — Encounter (HOSPITAL_COMMUNITY)
Admission: RE | Admit: 2017-01-23 | Discharge: 2017-01-23 | Disposition: A | Discharge status: Home / Self Care | Payer: 59 | Source: Ambulatory Visit | Attending: Endocrinology | Admitting: Endocrinology

## 2017-01-23 DIAGNOSIS — E0590 Thyrotoxicosis, unspecified without thyrotoxic crisis or storm: Secondary | ICD-10-CM

## 2017-01-23 MED ORDER — SODIUM IODIDE I 131 CAPSULE
7.5000 | Freq: Once | INTRAVENOUS | Status: AC | PRN
  Administered 2017-01-23: 7.5 via ORAL

## 2017-01-24 ENCOUNTER — Encounter (HOSPITAL_COMMUNITY)
Admission: RE | Admit: 2017-01-24 | Discharge: 2017-01-24 | Disposition: A | Discharge status: Home / Self Care | Payer: 59 | Source: Ambulatory Visit | Attending: Endocrinology | Admitting: Endocrinology

## 2017-01-24 MED ORDER — SODIUM PERTECHNETATE TC 99M INJECTION: 10.0000 | Freq: Once | INTRAVENOUS | Status: DC | PRN

## 2017-10-04 ENCOUNTER — Other Ambulatory Visit: Payer: Self-pay | Admitting: Obstetrics and Gynecology

## 2017-10-04 DIAGNOSIS — N6310 Unspecified lump in the right breast, unspecified quadrant: Secondary | ICD-10-CM

## 2017-10-06 ENCOUNTER — Ambulatory Visit
Admission: RE | Admit: 2017-10-06 | Discharge: 2017-10-06 | Disposition: A | Discharge status: Home / Self Care | Payer: 59 | Source: Ambulatory Visit | Attending: Obstetrics and Gynecology | Admitting: Obstetrics and Gynecology

## 2017-10-06 DIAGNOSIS — N6310 Unspecified lump in the right breast, unspecified quadrant: Secondary | ICD-10-CM

## 2017-11-15 ENCOUNTER — Other Ambulatory Visit: Payer: Self-pay | Admitting: Surgery

## 2017-11-15 DIAGNOSIS — E0590 Thyrotoxicosis, unspecified without thyrotoxic crisis or storm: Secondary | ICD-10-CM

## 2017-11-28 ENCOUNTER — Ambulatory Visit
Admission: RE | Admit: 2017-11-28 | Discharge: 2017-11-28 | Disposition: A | Discharge status: Home / Self Care | Payer: 59 | Source: Ambulatory Visit | Attending: Surgery | Admitting: Surgery

## 2017-11-28 DIAGNOSIS — E0590 Thyrotoxicosis, unspecified without thyrotoxic crisis or storm: Secondary | ICD-10-CM

## 2018-06-19 ENCOUNTER — Other Ambulatory Visit: Payer: Self-pay | Admitting: Endocrinology

## 2018-06-19 ENCOUNTER — Other Ambulatory Visit (HOSPITAL_COMMUNITY): Payer: Self-pay | Admitting: Endocrinology

## 2018-06-19 DIAGNOSIS — E0590 Thyrotoxicosis, unspecified without thyrotoxic crisis or storm: Secondary | ICD-10-CM

## 2018-06-28 ENCOUNTER — Ambulatory Visit (HOSPITAL_COMMUNITY): Payer: 59

## 2018-06-29 ENCOUNTER — Ambulatory Visit (HOSPITAL_COMMUNITY): Payer: 59

## 2018-07-16 ENCOUNTER — Ambulatory Visit (HOSPITAL_COMMUNITY)
Admission: RE | Admit: 2018-07-16 | Discharge: 2018-07-16 | Disposition: A | Discharge status: Home / Self Care | Payer: 59 | Source: Ambulatory Visit | Attending: Endocrinology | Admitting: Endocrinology

## 2018-07-16 ENCOUNTER — Other Ambulatory Visit: Payer: Self-pay

## 2018-07-16 ENCOUNTER — Other Ambulatory Visit (HOSPITAL_COMMUNITY): Payer: 59

## 2018-07-16 DIAGNOSIS — E0590 Thyrotoxicosis, unspecified without thyrotoxic crisis or storm: Secondary | ICD-10-CM | POA: Insufficient documentation

## 2018-07-16 MED ORDER — SODIUM IODIDE I 131 CAPSULE
6.0000 | Freq: Once | INTRAVENOUS | Status: AC | PRN
  Administered 2018-07-16: 6 via ORAL

## 2018-07-17 ENCOUNTER — Other Ambulatory Visit (HOSPITAL_COMMUNITY): Payer: 59

## 2018-07-17 ENCOUNTER — Ambulatory Visit (HOSPITAL_COMMUNITY)
Admission: RE | Admit: 2018-07-17 | Discharge: 2018-07-17 | Disposition: A | Discharge status: Home / Self Care | Payer: 59 | Source: Ambulatory Visit | Attending: Endocrinology | Admitting: Endocrinology

## 2018-07-17 MED ORDER — SODIUM PERTECHNETATE TC 99M INJECTION
10.0000 | Freq: Once | INTRAVENOUS | Status: AC | PRN
  Administered 2018-07-17: 10 via INTRAVENOUS

## 2018-07-18 ENCOUNTER — Other Ambulatory Visit (HOSPITAL_COMMUNITY): Payer: 59

## 2018-08-01 ENCOUNTER — Other Ambulatory Visit: Payer: Self-pay | Admitting: *Deleted

## 2018-08-01 DIAGNOSIS — Z122 Encounter for screening for malignant neoplasm of respiratory organs: Secondary | ICD-10-CM

## 2018-08-01 DIAGNOSIS — Z87891 Personal history of nicotine dependence: Secondary | ICD-10-CM

## 2018-08-27 ENCOUNTER — Ambulatory Visit (INDEPENDENT_AMBULATORY_CARE_PROVIDER_SITE_OTHER): Payer: 59 | Admitting: Acute Care

## 2018-08-27 ENCOUNTER — Other Ambulatory Visit: Payer: Self-pay

## 2018-08-27 ENCOUNTER — Ambulatory Visit: Payer: 59

## 2018-08-27 ENCOUNTER — Encounter: Payer: Self-pay | Admitting: Acute Care

## 2018-08-27 VITALS — BP 140/88 | HR 91 | Ht 63.0 in | Wt 140.0 lb

## 2018-08-27 DIAGNOSIS — Z87891 Personal history of nicotine dependence: Secondary | ICD-10-CM | POA: Diagnosis not present

## 2018-08-27 NOTE — Progress Notes (Signed)
Shared Decision Making Visit Lung Cancer Screening Program [phone]([id number])   Eligibility:  Age 68 y.o.  Pack Years Smoking History Calculation 36 pack year smoking history (# packs/per year x # years smoked)  Recent History of coughing up blood  no  Unexplained weight loss? no ( >Than 15 pounds within the last 6 months )  Prior History Lung / other cancer no (Diagnosis within the last 5 years already requiring surveillance chest CT Scans).  Smoking Status Former Smoker  Former Smokers: Years since quit: < 1 year  Quit Date: 04/2018  Visit Components:  Discussion included one or more decision making aids. yes  Discussion included risk/benefits of screening. yes  Discussion included potential follow up diagnostic testing for abnormal scans. yes  Discussion included meaning and risk of over diagnosis. yes  Discussion included meaning and risk of False Positives. yes  Discussion included meaning of total radiation exposure. yes  Counseling Included:  Importance of adherence to annual lung cancer LDCT screening. yes  Impact of comorbidities on ability to participate in the program. yes  Ability and willingness to under diagnostic treatment. yes  Smoking Cessation Counseling:  Current Smokers:   Discussed importance of smoking cessation. yes  Information about tobacco cessation classes and interventions provided to patient. yes  Patient provided with "ticket" for LDCT Scan. yes  Symptomatic Patient. no  Counseling  Diagnosis Code: Tobacco Use Z72.0  Asymptomatic Patient yes  Counseling (Intermediate counseling: > three minutes counseling) [id number]  Former Smokers:   Discussed the importance of maintaining cigarette abstinence. yes  Diagnosis Code: Personal History of Nicotine Dependence. O96.295Z87.891  Information about tobacco cessation classes and interventions provided to patient. Yes  Patient provided with "ticket" for LDCT Scan. yes  Written Order for Lung  Cancer Screening with LDCT placed in Epic. Yes (CT Chest Lung Cancer Screening Low Dose W/O CM) [id number] Z12.2-Screening of respiratory organs Z87.891-Personal history of nicotine dependence   BP 140/88 (BP Location: Left Arm, Cuff Size: Normal)   Pulse 91   Ht 5\' 3"  (1.6 m)   Wt 140 lb (63.5 kg)   SpO2 95%   BMI 24.80 kg/m    I spent 25 minutes of face to face time with Molly Myers discussing the risks and benefits of lung cancer screening. We viewed a power point together that explained in detail the above noted topics. We took the time to pause the power point at intervals to allow for questions to be asked and answered to ensure understanding. We discussed that she had taken the single most powerful action possible to decrease her risk of developing lung cancer when she quit smoking. I counseled her to remain smoke free, and to contact me if she ever had the desire to smoke again so that I can provide resources and tools to help support the effort to remain smoke free. We discussed the time and location of the scan, and that either  [name] RN or I will call with the results within  24-48 hours of receiving them. She has my card and contact information in the event she needs to speak with me, in addition to a copy of the power point we reviewed as a resource. She verbalized understanding of all of the above and had no further questions upon leaving the office.   I explained to the patient that there has been a high incidence of coronary artery disease noted on these exams. I explained that this is a non-gated exam therefore degree or severity  cannot be determined. This patient is not on statin therapy. I have asked the patient to follow-up with their PCP regarding any incidental finding of coronary artery disease and management with diet or medication as they feel is clinically indicated. The patient verbalized understanding of the above and had no further questions.     Magdalen Spatz,  NP 08/27/2018 4:39 PM

## 2018-10-08 ENCOUNTER — Ambulatory Visit
Admission: RE | Admit: 2018-10-08 | Discharge: 2018-10-08 | Disposition: A | Discharge status: Home / Self Care | Payer: 59 | Source: Ambulatory Visit | Attending: Acute Care | Admitting: Acute Care

## 2018-10-08 ENCOUNTER — Other Ambulatory Visit: Payer: Self-pay

## 2018-10-08 DIAGNOSIS — Z122 Encounter for screening for malignant neoplasm of respiratory organs: Secondary | ICD-10-CM

## 2018-10-08 DIAGNOSIS — Z87891 Personal history of nicotine dependence: Secondary | ICD-10-CM

## 2018-10-10 ENCOUNTER — Other Ambulatory Visit: Payer: Self-pay | Admitting: *Deleted

## 2018-10-10 DIAGNOSIS — Z87891 Personal history of nicotine dependence: Secondary | ICD-10-CM

## 2018-10-10 DIAGNOSIS — Z122 Encounter for screening for malignant neoplasm of respiratory organs: Secondary | ICD-10-CM

## 2019-02-14 ENCOUNTER — Other Ambulatory Visit: Payer: Self-pay | Admitting: Pain Medicine

## 2019-02-14 DIAGNOSIS — M5136 Other intervertebral disc degeneration, lumbar region: Secondary | ICD-10-CM

## 2019-03-14 ENCOUNTER — Other Ambulatory Visit: Payer: 59

## 2019-04-06 ENCOUNTER — Ambulatory Visit
Admission: RE | Admit: 2019-04-06 | Discharge: 2019-04-06 | Disposition: A | Discharge status: Home / Self Care | Payer: 59 | Source: Ambulatory Visit | Attending: Pain Medicine | Admitting: Pain Medicine

## 2019-04-06 DIAGNOSIS — M5136 Other intervertebral disc degeneration, lumbar region: Secondary | ICD-10-CM

## 2020-08-19 ENCOUNTER — Other Ambulatory Visit: Payer: Self-pay | Admitting: Physician Assistant

## 2020-08-19 ENCOUNTER — Ambulatory Visit
Admission: RE | Admit: 2020-08-19 | Discharge: 2020-08-19 | Disposition: A | Discharge status: Home / Self Care | Payer: Medicare Other | Source: Ambulatory Visit | Attending: Physician Assistant | Admitting: Physician Assistant

## 2020-08-19 ENCOUNTER — Other Ambulatory Visit: Payer: Self-pay

## 2020-08-19 DIAGNOSIS — M79642 Pain in left hand: Secondary | ICD-10-CM

## 2020-08-19 DIAGNOSIS — M79641 Pain in right hand: Secondary | ICD-10-CM

## 2020-11-13 ENCOUNTER — Other Ambulatory Visit: Payer: Self-pay | Admitting: Internal Medicine

## 2020-11-13 DIAGNOSIS — E7800 Pure hypercholesterolemia, unspecified: Secondary | ICD-10-CM

## 2020-12-10 ENCOUNTER — Inpatient Hospital Stay: Admission: RE | Admit: 2020-12-10 | Payer: Medicare Other | Source: Ambulatory Visit

## 2020-12-30 ENCOUNTER — Other Ambulatory Visit: Payer: Medicare Other

## 2021-01-22 ENCOUNTER — Other Ambulatory Visit: Payer: Medicare Other

## 2021-02-22 IMAGING — CT CT CHEST LUNG CANCER SCREENING LOW DOSE W/O CM
1 series · 10 of 10 positions shown, 13 images · non-contrast
Comparison: None

CLINICAL DATA: Lung cancer screening. Thirty-six pack-year history.
Former asymptomatic smoker.

EXAM:
CT CHEST WITHOUT CONTRAST LOW-DOSE FOR LUNG CANCER SCREENING
TECHNIQUE: Multidetector CT imaging of the chest was performed following the
standard protocol without IV contrast.

[ct lung segmentation data · axial · 0.62mm/px · z∈[-338,-338]mm · 10 of 333 frames shown]
[frame 1/333  mediastinal]
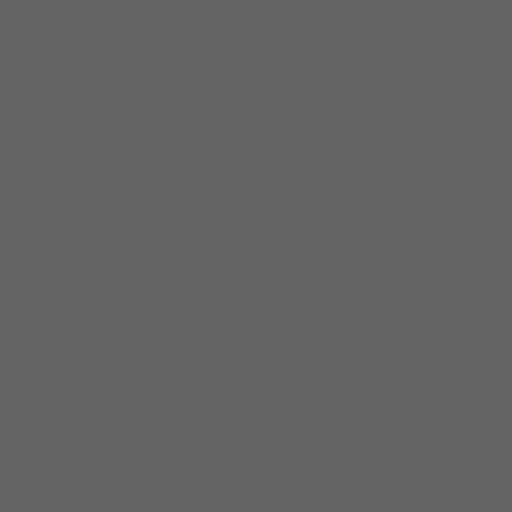
[frame 1/333  lung]
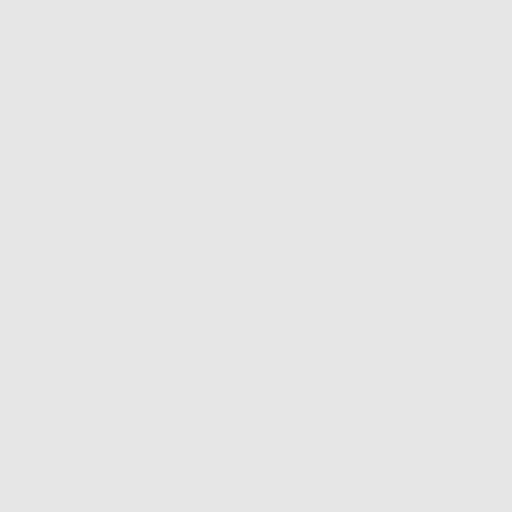
[frame 37/333  lung]
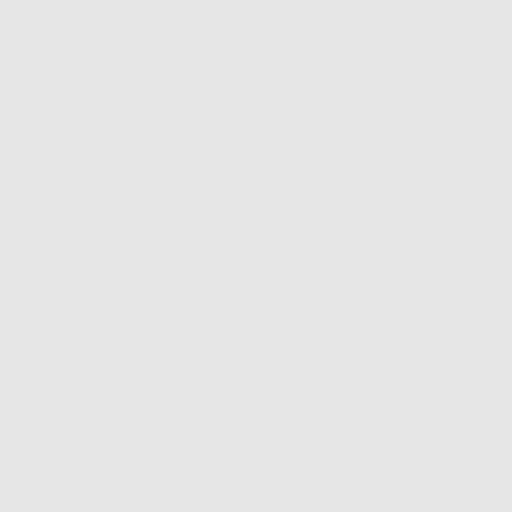
[frame 74/333  lung]
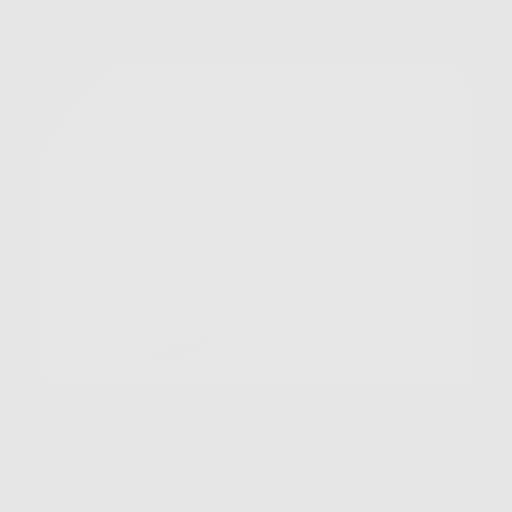
[frame 111/333  lung]
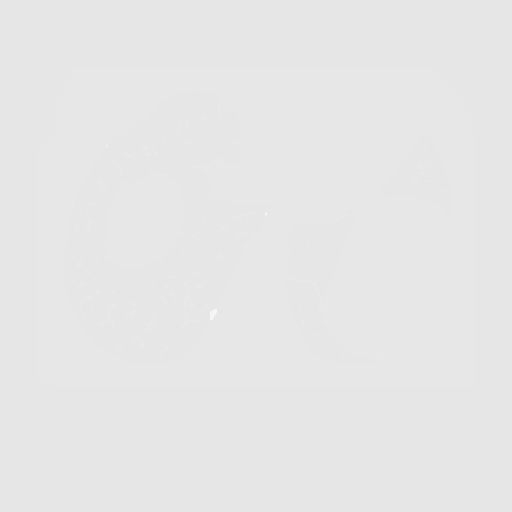
[frame 148/333  mediastinal]
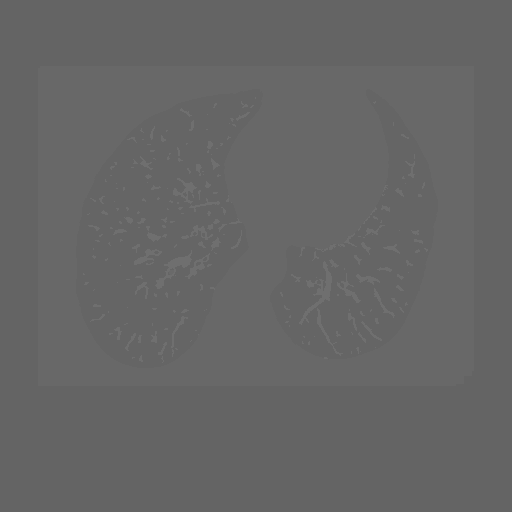
[frame 148/333  lung]
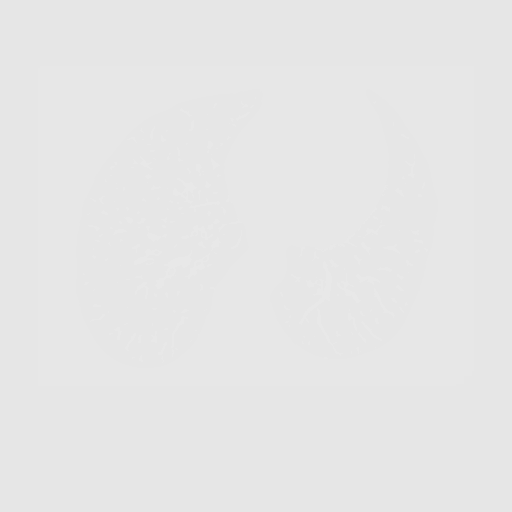
[frame 185/333  lung]
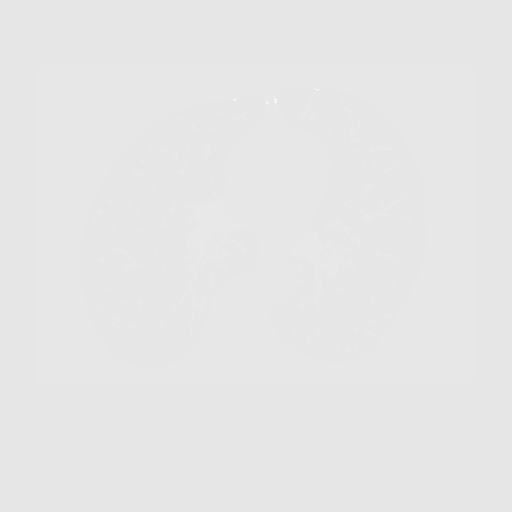
[frame 222/333  lung]
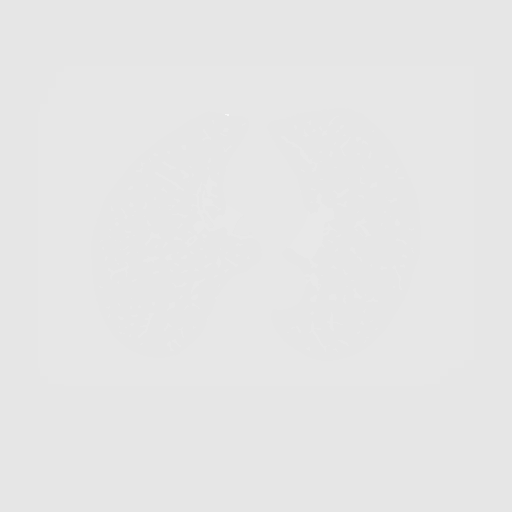
[frame 259/333  lung]
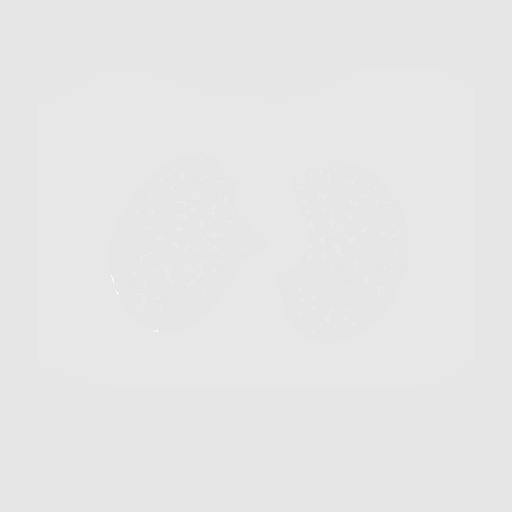
[frame 296/333  mediastinal]
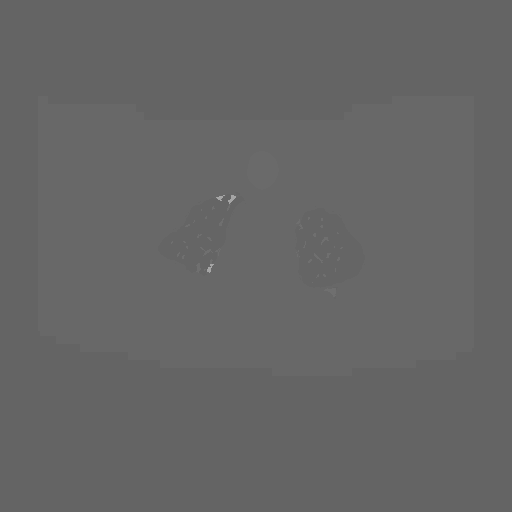
[frame 296/333  lung]
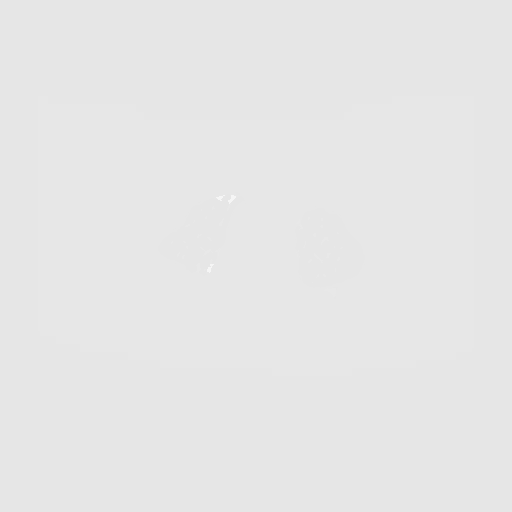
[frame 333/333  lung]
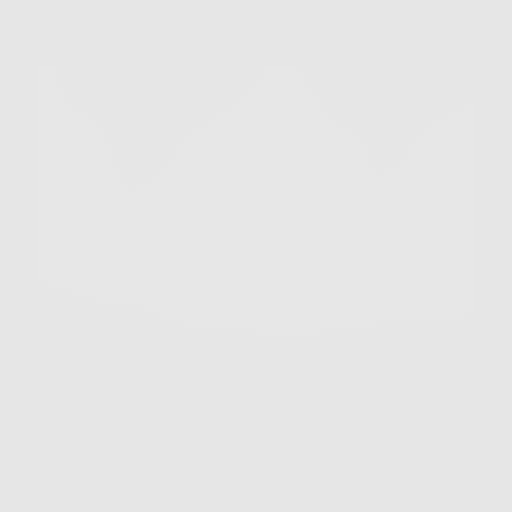

[10 of 10 positions shown; findings below may reference images not displayed]

FINDINGS: Cardiovascular: The heart size appears within normal limits. Aortic
atherosclerosis identified. No pericardial effusion. Three vessel
coronary artery calcifications identified

Mediastinum/Nodes: No enlarged mediastinal, hilar, or axillary lymph
nodes. Thyroid gland, trachea, and esophagus demonstrate no
significant findings.

Lungs/Pleura: No pleural effusion. Scarring identified in the left
lower lobe. Several small pulmonary nodules are identified. The
largest pulmonary nodule is in the lateral right upper lobe with an
equivalent diameter of 2.5 mm.

Upper Abdomen: No acute abnormality.

Musculoskeletal: No aggressive lytic or sclerotic bone lesions
identified.
IMPRESSION: 1. Lung-RADS 2, benign appearance or behavior. Continue annual
screening with low-dose chest CT without contrast in 12 months.
2.  Aortic Atherosclerosis ([id number]).
3. Coronary artery calcifications.

## 2021-03-08 ENCOUNTER — Other Ambulatory Visit: Payer: Medicare Other

## 2021-05-13 ENCOUNTER — Ambulatory Visit
Admission: RE | Admit: 2021-05-13 | Discharge: 2021-05-13 | Disposition: A | Discharge status: Home / Self Care | Payer: No Typology Code available for payment source | Source: Ambulatory Visit | Attending: Internal Medicine | Admitting: Internal Medicine

## 2021-05-13 DIAGNOSIS — E7800 Pure hypercholesterolemia, unspecified: Secondary | ICD-10-CM

## 2021-12-08 ENCOUNTER — Other Ambulatory Visit: Payer: Self-pay | Admitting: Anesthesiology

## 2021-12-08 DIAGNOSIS — G540 Brachial plexus disorders: Secondary | ICD-10-CM

## 2021-12-08 DIAGNOSIS — M545 Low back pain, unspecified: Secondary | ICD-10-CM

## 2021-12-08 DIAGNOSIS — M542 Cervicalgia: Secondary | ICD-10-CM

## 2021-12-08 DIAGNOSIS — M5030 Other cervical disc degeneration, unspecified cervical region: Secondary | ICD-10-CM

## 2021-12-08 DIAGNOSIS — M5136 Other intervertebral disc degeneration, lumbar region: Secondary | ICD-10-CM

## 2021-12-14 LAB — COLOGUARD: COLOGUARD: NEGATIVE

## 2021-12-29 ENCOUNTER — Ambulatory Visit
Admission: RE | Admit: 2021-12-29 | Discharge: 2021-12-29 | Disposition: A | Discharge status: Home / Self Care | Payer: Medicare Other | Source: Ambulatory Visit | Attending: Anesthesiology | Admitting: Anesthesiology

## 2021-12-29 DIAGNOSIS — M5030 Other cervical disc degeneration, unspecified cervical region: Secondary | ICD-10-CM

## 2021-12-29 DIAGNOSIS — G540 Brachial plexus disorders: Secondary | ICD-10-CM

## 2021-12-29 DIAGNOSIS — M5136 Other intervertebral disc degeneration, lumbar region: Secondary | ICD-10-CM

## 2021-12-29 DIAGNOSIS — M545 Low back pain, unspecified: Secondary | ICD-10-CM

## 2021-12-29 DIAGNOSIS — M542 Cervicalgia: Secondary | ICD-10-CM

## 2023-06-19 ENCOUNTER — Other Ambulatory Visit: Payer: Self-pay | Admitting: Internal Medicine

## 2023-06-19 DIAGNOSIS — R918 Other nonspecific abnormal finding of lung field: Secondary | ICD-10-CM

## 2023-06-30 ENCOUNTER — Ambulatory Visit
Admission: RE | Admit: 2023-06-30 | Discharge: 2023-06-30 | Disposition: A | Discharge status: Home / Self Care | Source: Ambulatory Visit | Attending: Internal Medicine | Admitting: Internal Medicine

## 2023-06-30 DIAGNOSIS — R918 Other nonspecific abnormal finding of lung field: Secondary | ICD-10-CM

## 2023-07-27 ENCOUNTER — Other Ambulatory Visit: Payer: Self-pay | Admitting: Internal Medicine

## 2023-07-27 DIAGNOSIS — K838 Other specified diseases of biliary tract: Secondary | ICD-10-CM

## 2023-07-31 ENCOUNTER — Ambulatory Visit
Admission: RE | Admit: 2023-07-31 | Discharge: 2023-07-31 | Discharge status: Home / Self Care | Source: Ambulatory Visit | Attending: Internal Medicine | Admitting: Internal Medicine

## 2023-07-31 DIAGNOSIS — K838 Other specified diseases of biliary tract: Secondary | ICD-10-CM

## 2023-09-16 ENCOUNTER — Other Ambulatory Visit: Payer: Self-pay

## 2023-09-16 ENCOUNTER — Emergency Department (HOSPITAL_COMMUNITY)

## 2023-09-16 ENCOUNTER — Emergency Department (HOSPITAL_COMMUNITY)
Admission: EM | Admit: 2023-09-16 | Discharge: 2023-09-17 | Disposition: A | Discharge status: Home / Self Care | Source: Ambulatory Visit | Attending: Emergency Medicine | Admitting: Emergency Medicine

## 2023-09-16 ENCOUNTER — Encounter (HOSPITAL_COMMUNITY): Payer: Self-pay | Admitting: Emergency Medicine

## 2023-09-16 DIAGNOSIS — R0789 Other chest pain: Secondary | ICD-10-CM | POA: Diagnosis present

## 2023-09-16 DIAGNOSIS — Z79890 Hormone replacement therapy (postmenopausal): Secondary | ICD-10-CM | POA: Insufficient documentation

## 2023-09-16 DIAGNOSIS — I2510 Atherosclerotic heart disease of native coronary artery without angina pectoris: Secondary | ICD-10-CM | POA: Insufficient documentation

## 2023-09-16 DIAGNOSIS — K449 Diaphragmatic hernia without obstruction or gangrene: Secondary | ICD-10-CM | POA: Diagnosis not present

## 2023-09-16 DIAGNOSIS — R918 Other nonspecific abnormal finding of lung field: Secondary | ICD-10-CM | POA: Insufficient documentation

## 2023-09-16 DIAGNOSIS — E039 Hypothyroidism, unspecified: Secondary | ICD-10-CM | POA: Diagnosis not present

## 2023-09-16 DIAGNOSIS — K760 Fatty (change of) liver, not elsewhere classified: Secondary | ICD-10-CM | POA: Diagnosis not present

## 2023-09-16 DIAGNOSIS — J40 Bronchitis, not specified as acute or chronic: Secondary | ICD-10-CM | POA: Insufficient documentation

## 2023-09-16 DIAGNOSIS — I700 Atherosclerosis of aorta: Secondary | ICD-10-CM | POA: Diagnosis not present

## 2023-09-16 DIAGNOSIS — M8588 Other specified disorders of bone density and structure, other site: Secondary | ICD-10-CM | POA: Diagnosis not present

## 2023-09-16 DIAGNOSIS — M4184 Other forms of scoliosis, thoracic region: Secondary | ICD-10-CM | POA: Diagnosis not present

## 2023-09-16 DIAGNOSIS — E876 Hypokalemia: Secondary | ICD-10-CM | POA: Diagnosis not present

## 2023-09-16 DIAGNOSIS — R079 Chest pain, unspecified: Secondary | ICD-10-CM | POA: Diagnosis not present

## 2023-09-16 DIAGNOSIS — R600 Localized edema: Secondary | ICD-10-CM | POA: Diagnosis not present

## 2023-09-16 DIAGNOSIS — R739 Hyperglycemia, unspecified: Secondary | ICD-10-CM

## 2023-09-16 DIAGNOSIS — R000 Tachycardia, unspecified: Secondary | ICD-10-CM | POA: Insufficient documentation

## 2023-09-16 DIAGNOSIS — H814 Vertigo of central origin: Secondary | ICD-10-CM | POA: Diagnosis present

## 2023-09-16 DIAGNOSIS — M79662 Pain in left lower leg: Secondary | ICD-10-CM | POA: Insufficient documentation

## 2023-09-16 LAB — CBC WITH DIFFERENTIAL/PLATELET
Abs Immature Granulocytes: 0.04 K/uL (ref 0.00–0.07)
Basophils Absolute: 0.1 K/uL (ref 0.0–0.1)
Basophils Relative: 1 %
Eosinophils Absolute: 0.3 K/uL (ref 0.0–0.5)
Eosinophils Relative: 5 %
HCT: 43.6 % (ref 36.0–46.0)
Hemoglobin: 14.4 g/dL (ref 12.0–15.0)
Immature Granulocytes: 1 %
Lymphocytes Relative: 18 %
Lymphs Abs: 1.2 K/uL (ref 0.7–4.0)
MCH: 28.3 pg (ref 26.0–34.0)
MCHC: 33 g/dL (ref 30.0–36.0)
MCV: 85.7 fL (ref 80.0–100.0)
Monocytes Absolute: 0.6 K/uL (ref 0.1–1.0)
Monocytes Relative: 9 %
Neutro Abs: 4.5 K/uL (ref 1.7–7.7)
Neutrophils Relative %: 66 %
Platelets: 244 K/uL (ref 150–400)
RBC: 5.09 MIL/uL (ref 3.87–5.11)
RDW: 13.2 % (ref 11.5–15.5)
WBC: 6.7 K/uL (ref 4.0–10.5)
nRBC: 0 % (ref 0.0–0.2)

## 2023-09-16 LAB — BASIC METABOLIC PANEL WITH GFR
Anion gap: 11 (ref 5–15)
BUN: 15 mg/dL (ref 8–23)
CO2: 26 mmol/L (ref 22–32)
Calcium: 8.9 mg/dL (ref 8.9–10.3)
Chloride: 98 mmol/L (ref 98–111)
Creatinine, Ser: 1 mg/dL (ref 0.44–1.00)
GFR, Estimated: 59 mL/min — ABNORMAL LOW (ref >60)
Glucose, Bld: 140 mg/dL — ABNORMAL HIGH (ref 70–99)
Potassium: 3.2 mmol/L — ABNORMAL LOW (ref 3.5–5.1)
Sodium: 135 mmol/L (ref 135–145)

## 2023-09-16 MED ORDER — METHYLPREDNISOLONE SODIUM SUCC 40 MG IJ SOLR
40.0000 mg | Freq: Once | INTRAMUSCULAR | Status: AC
  Administered 2023-09-16: 40 mg via INTRAVENOUS
  Filled 2023-09-16: qty 1

## 2023-09-16 MED ORDER — RIVAROXABAN 15 MG PO TABS
15.0000 mg | ORAL_TABLET | Freq: Once | ORAL | Status: AC
  Administered 2023-09-16: 15 mg via ORAL
  Filled 2023-09-16: qty 1

## 2023-09-16 MED ORDER — DIPHENHYDRAMINE HCL 50 MG/ML IJ SOLN: 50.0000 mg | Freq: Once | INTRAMUSCULAR | Status: AC

## 2023-09-16 MED ORDER — POTASSIUM CHLORIDE CRYS ER 20 MEQ PO TBCR
40.0000 meq | EXTENDED_RELEASE_TABLET | ORAL | Status: AC
  Administered 2023-09-16: 40 meq via ORAL
  Filled 2023-09-16: qty 2

## 2023-09-16 MED ORDER — DIPHENHYDRAMINE HCL 25 MG PO CAPS
50.0000 mg | ORAL_CAPSULE | Freq: Once | ORAL | Status: AC
  Administered 2023-09-17: 50 mg via ORAL
  Filled 2023-09-16: qty 2

## 2023-09-16 NOTE — ED Provider Notes (Signed)
 [place] EMERGENCY DEPARTMENT AT [facility] Provider Note   CSN: [id number] Arrival date & time: 09/16/23  2114     Patient presents with: Altered Mental Status and Abnormal Labs   Molly Myers is a 73 y.o. female.   73 year old female with a history of hypothyroidism who presents to the emergency department with an elevated D-dimer.  Patient reports that recently she was seen because of left calf pain that has been present for several days.  Says that that leg is more swollen than the contralateral leg but has been that way ever since she broke her right leg years ago.  Says that she occasionally gets stabbing sensations in her chest that are fleeting and migrate from the right to left side.  No shortness of breath or cough.  Does have an implant that she says releases estrogen, testosterone, and progesterone.  Was seen in urgent care yesterday and had a positive D-dimer and was told to come to the emergency department.  No history of cancer.  No DVT or PE history.  Not on blood thinners       Prior to Admission medications   Medication Sig Start Date End Date Taking? Authorizing Provider  ADVAIR DISKUS 250-50 MCG/DOSE AEPB INHALE 1 PUFF INTO THE LUNGS EVERY 12 HOURS NEED APPOINTMENT FOR FURTHER REFILLS 10/17/16   Darlean Ozell NOVAK, MD  albuterol (PROVENTIL HFA;VENTOLIN HFA) 108 (90 Base) MCG/ACT inhaler Inhale 1-2 puffs into the lungs every 6 (six) hours as needed for wheezing or shortness of breath.    [provider]  albuterol (PROVENTIL) (2.5 MG/3ML) 0.083% nebulizer solution 1 vial in nebulizer every 6 hours as needed 04/10/15   [provider]  DHEA 10 MG TABS Take 10 mg by mouth daily.    [provider]  metaxalone (SKELAXIN) 800 MG tablet Take 800 mg by mouth every 8 (eight) hours as needed. 06/29/15   [provider]  Oxycodone HCl 10 MG TABS Take 10 mg by mouth 4 (four) times daily as needed. For back pain 04/06/15   [provider]  UNABLE TO FIND Med Name: hormone cream    [provider]  XTAMPZA ER 27 MG C12A TAKE ONE CAPSULE EVERY 12 HOURS 07/03/16   [provider]    Allergies: Iodinated contrast media and Erythromycin    Review of Systems  Updated Vital Signs BP (!) 160/97 (BP Location: Left Arm)   Pulse (!) 107   Temp 98.2 F (36.8 C) (Oral)   Resp 18   Ht 5' 3 (1.6 m)   Wt 63.5 kg   SpO2 99%   BMI 24.80 kg/m   Physical Exam Vitals and nursing note reviewed.  Constitutional:      General: She is not in acute distress.    Appearance: She is well-developed.  HENT:     Head: Normocephalic and atraumatic.     Right Ear: External ear normal.     Left Ear: External ear normal.     Nose: Nose normal.  Eyes:     Extraocular Movements: Extraocular movements intact.     Conjunctiva/sclera: Conjunctivae normal.     Pupils: Pupils are equal, round, and reactive to light.  Cardiovascular:     Rate and Rhythm: Regular rhythm. Tachycardia present.     Heart sounds: No murmur heard. Pulmonary:     Effort: Pulmonary effort is normal. No respiratory distress.     Breath sounds: Normal breath sounds.  Musculoskeletal:  Cervical back: Normal range of motion and neck supple.     Right lower leg: No edema.     Left lower leg: Edema present.  Skin:    General: Skin is warm and dry.  Neurological:     Mental Status: She is alert and oriented to person, place, and time. Mental status is at baseline.  Psychiatric:        Mood and Affect: Mood normal.     (all labs ordered are listed, but only abnormal results are displayed) Labs Reviewed  BASIC METABOLIC PANEL WITH GFR - Abnormal; Notable for the following components:      Result Value   Potassium 3.2 (*)    Glucose, Bld 140 (*)    GFR, Estimated 59 (*)    All other components within normal limits  CBC WITH DIFFERENTIAL/PLATELET    EKG: None  Radiology: No results found.   Procedures  EMERGENCY  DEPARTMENT US  EXTREMITY EXAM Study:  Limited Duplex of Left Lower Extremity Veins  INDICATIONS: Leg pain Visualization of  regions in transverse plane with full compression visualized.   PERFORMED BY: Myself IMAGES ARCHIVED?: Yes VIEWS USED: Saphenous-femoral junction, Proximal femoral vein, and Popliteal vein INTERPRETATION: No DVT visualized      Medications Ordered in the ED  diphenhydrAMINE  (BENADRYL ) capsule 50 mg (has no administration in time range)    Or  diphenhydrAMINE  (BENADRYL ) injection 50 mg (has no administration in time range)  Rivaroxaban  (XARELTO ) tablet 15 mg (has no administration in time range)  methylPREDNISolone  sodium succinate (SOLU-MEDROL ) 40 mg/mL injection 40 mg (40 mg Intravenous Given 09/16/23 2235)  potassium chloride  SA (KLOR-CON  M) CR tablet 40 mEq (40 mEq Oral Given 09/16/23 2332)    Clinical Course as of 09/16/23 2337  Sat Sep 16, 2023  2326 Signed out to Dr Raford [RP]    Clinical Course User Index [RP] Yolande Lamar BROCKS, MD                                 Medical Decision Making Amount and/or Complexity of Data Reviewed Labs: ordered. Radiology: ordered.  Risk Prescription drug management.   73 year old female with a history of hypothyroidism who presents to the emergency department with an elevated D-dimer.    Initial Ddx:  DVT, PE, MI, muscle cramping, rhabdomyolysis, electrolyte abnormality   MDM/Course:  Patient presents to the emergency department with And chest pain.  Also has Swelling but this appears to be more chronic in nature.  Is on hormone therapy.  Had a D-dimer that was elevated as an outpatient.  Concerned about possible DVT/PE for the patient.  Given the fleeting nature of her anginal symptoms feel that MI is less frequent.  Unfortunately we do not have vascular ultrasound at this time to obtain a DVT ultrasound so bedside ultrasound was performed that did not show evidence of DVT.  She has a contrast allergy so we  will put her in for steroids and Benadryl  before she gets the CTA of her chest.  Signed out to the oncoming physician awaiting the results of her imaging.    This patient presents to the ED for concern of complaints listed in HPI, this involves an extensive number of treatment options, and is a complaint that carries with it a high risk of complications and morbidity. Disposition including potential need for admission considered.   Dispo: Pending remainder of workup  Additional history obtained from spouse Records  reviewed Outpatient Clinic Notes The following labs were independently interpreted: Chemistry and show hypokalemia I personally reviewed and interpreted cardiac monitoring: normal sinus rhythm  I have reviewed the patients home medications and made adjustments as needed Social Determinants of health:  Geriatric  Portions of this note were generated with Scientist, clinical (histocompatibility and immunogenetics). Dictation errors may occur despite best attempts at proofreading.     Final diagnoses:  Pain of left calf  Chest pain, unspecified type    ED Discharge Orders     None          Yolande Lamar BROCKS, MD 09/16/23 2337

## 2023-09-16 NOTE — ED Provider Notes (Signed)
 Care assumed from Dr. Yolande, patient with left calf pain and chest pain and swelling pending CTA of chest, venous doppler of left leg. Plan is for discharge if negative.  Patient requested CT of her head in addition to CT of her chest.  Both scans showed no evidence of any acute process.  Have independently viewed the images, and agree with the radiologist's interpretation.  Patient stated she did not wish to stay in the department to wait for vascular ultrasound in the morning.  I am discharging her with arrangements for outpatient vascular ultrasound later today, prescription for oral potassium.  Patient told me that she does not think that she has a blood clot in her leg, I stressed the importance of definitively determining if there is a blood clot because of the dangers of pulmonary embolism with untreated blood clots.  Results for orders placed or performed during the hospital encounter of 09/16/23  CBC with Differential   Collection Time: 09/16/23  9:52 PM  Result Value Ref Range   WBC 6.7 4.0 - 10.5 K/uL   RBC 5.09 3.87 - 5.11 MIL/uL   Hemoglobin 14.4 12.0 - 15.0 g/dL   HCT 56.3 63.9 - 53.9 %   MCV 85.7 80.0 - 100.0 fL   MCH 28.3 26.0 - 34.0 pg   MCHC 33.0 30.0 - 36.0 g/dL   RDW 86.7 88.4 - 84.4 %   Platelets 244 150 - 400 K/uL   nRBC 0.0 0.0 - 0.2 %   Neutrophils Relative % 66 %   Neutro Abs 4.5 1.7 - 7.7 K/uL   Lymphocytes Relative 18 %   Lymphs Abs 1.2 0.7 - 4.0 K/uL   Monocytes Relative 9 %   Monocytes Absolute 0.6 0.1 - 1.0 K/uL   Eosinophils Relative 5 %   Eosinophils Absolute 0.3 0.0 - 0.5 K/uL   Basophils Relative 1 %   Basophils Absolute 0.1 0.0 - 0.1 K/uL   Immature Granulocytes 1 %   Abs Immature Granulocytes 0.04 0.00 - 0.07 K/uL  Basic metabolic panel   Collection Time: 09/16/23  9:52 PM  Result Value Ref Range   Sodium 135 135 - 145 mmol/L   Potassium 3.2 (L) 3.5 - 5.1 mmol/L   Chloride 98 98 - 111 mmol/L   CO2 26 22 - 32 mmol/L   Glucose, Bld 140 (H) 70  - 99 mg/dL   BUN 15 8 - 23 mg/dL   Creatinine, Ser 8.99 0.44 - 1.00 mg/dL   Calcium 8.9 8.9 - 89.6 mg/dL   GFR, Estimated 59 (L) >60 mL/min   Anion gap 11 5 - 15   CT Angio Chest PE W and/or Wo Contrast Result Date: 09/17/2023 CLINICAL DATA:  Central vertigo. Left calf pain and stabbing chest pain. EXAM: CT HEAD WITHOUT CONTRAST CT ANGIOGRAPHY CHEST WITH CONTRAST TECHNIQUE: Contiguous axial CT imaging was obtained from the skull base through the vertex without the use of IV contrast. Sagittal coronal reconstructions were created and reviewed. Multidetector CT imaging of the chest was performed using the standard protocol during bolus administration of intravenous contrast. Multiplanar CT image reconstructions and MIPs were obtained to evaluate the vascular anatomy. RADIATION DOSE REDUCTION: This exam was performed according to the departmental dose-optimization program which includes automated exposure control, adjustment of the mA and/or kV according to patient size and/or use of iterative reconstruction technique. CONTRAST:  75mL OMNIPAQUE  IOHEXOL  350 MG/ML SOLN COMPARISON:  Chest CT without contrast 06/30/2023, lung cancer screening chest CT 10/08/2018, with no prior  cross-sectional imaging of the brain for comparison. FINDINGS: CT HEAD WITHOUT CONTRAST FINDINGS Brain: There is mild cortical volume loss, without CT findings of significant small-vessel disease. The ventricles are normal in size and position. The basal cisterns are clear. No cortical based acute infarct, hemorrhage, mass or mass effect are seen. No appreciable old infarcts. Vasculature: No hyperdense central vessels or unexpected calcifications. Skull: Negative for fractures or focal lesions. Sinuses/orbits: Negative orbits apart from prior lens replacements. Clear paranasal sinuses and mastoids. Midline nasal septum. Other: None. CT ANGIOGRAPHY CHEST WITH CONTRAST FINDINGS Cardiovascular: There are left main and patchy three-vessel  coronary artery calcifications. The heart is slightly enlarged. There is no pericardial effusion. Pulmonary arterial opacification is diagnostic. No arterial embolism is seen. The pulmonary veins are nondistended. There is atherosclerosis in the aorta and great vessels, with mixed aortic plaque, aortic tortuosity. There is no aortic or great vessel aneurysm, stenosis or dissection. Mediastinum/Nodes: No enlarged mediastinal, hilar, or axillary lymph nodes. Thyroid  gland, trachea, and esophagus demonstrate no significant findings. Small hiatal hernia. Lungs/Pleura: Chronic biapical pleural-parenchymal scarring. There is diffuse bronchial thickening. Stable chronic 3 mm subpleural right upper lobe nodule on 14:32, benign given length of stability. Additional stable benign left upper lobe nodule posteriorly on 14:27. Scattered linear scar-like opacities again seen both bases and left lower lobe atelectasis alongside a slightly elevated left hemidiaphragm. There are no new or further nodules or active infiltrates. No pleural effusion or pneumothorax. Upper Abdomen: No acute abnormality. Status post cholecystectomy. Mild hepatic steatosis. Musculoskeletal: Thoracic spine kyphodextroscoliosis and degenerative changes. Mild osteopenia. No acute or other significant osseous findings. Chronically noted is a calcified collapsed left breast implant and a rim calcified intact right breast implant. No chest wall mass is seen. Review of the MIP images confirms the above findings. IMPRESSION: 1. No acute intracranial CT findings. 2. Aortic and coronary artery atherosclerosis. 3. No evidence of pulmonary arterial dilatation or embolus. 4. Bronchitis. 5. 2 stable benign lung nodules. 6. Small hiatal hernia. 7. Mild hepatic steatosis. 8. Osteopenia, kyphoscoliosis and degenerative change. Aortic Atherosclerosis (ICD10-I70.0). Electronically Signed   By: Francis Quam M.D.   On: 09/17/2023 02:15   CT Head Wo Contrast Result Date:  09/17/2023 CLINICAL DATA:  Central vertigo. Left calf pain and stabbing chest pain. EXAM: CT HEAD WITHOUT CONTRAST CT ANGIOGRAPHY CHEST WITH CONTRAST TECHNIQUE: Contiguous axial CT imaging was obtained from the skull base through the vertex without the use of IV contrast. Sagittal coronal reconstructions were created and reviewed. Multidetector CT imaging of the chest was performed using the standard protocol during bolus administration of intravenous contrast. Multiplanar CT image reconstructions and MIPs were obtained to evaluate the vascular anatomy. RADIATION DOSE REDUCTION: This exam was performed according to the departmental dose-optimization program which includes automated exposure control, adjustment of the mA and/or kV according to patient size and/or use of iterative reconstruction technique. CONTRAST:  75mL OMNIPAQUE  IOHEXOL  350 MG/ML SOLN COMPARISON:  Chest CT without contrast 06/30/2023, lung cancer screening chest CT 10/08/2018, with no prior cross-sectional imaging of the brain for comparison. FINDINGS: CT HEAD WITHOUT CONTRAST FINDINGS Brain: There is mild cortical volume loss, without CT findings of significant small-vessel disease. The ventricles are normal in size and position. The basal cisterns are clear. No cortical based acute infarct, hemorrhage, mass or mass effect are seen. No appreciable old infarcts. Vasculature: No hyperdense central vessels or unexpected calcifications. Skull: Negative for fractures or focal lesions. Sinuses/orbits: Negative orbits apart from prior lens replacements. Clear paranasal sinuses and  mastoids. Midline nasal septum. Other: None. CT ANGIOGRAPHY CHEST WITH CONTRAST FINDINGS Cardiovascular: There are left main and patchy three-vessel coronary artery calcifications. The heart is slightly enlarged. There is no pericardial effusion. Pulmonary arterial opacification is diagnostic. No arterial embolism is seen. The pulmonary veins are nondistended. There is  atherosclerosis in the aorta and great vessels, with mixed aortic plaque, aortic tortuosity. There is no aortic or great vessel aneurysm, stenosis or dissection. Mediastinum/Nodes: No enlarged mediastinal, hilar, or axillary lymph nodes. Thyroid  gland, trachea, and esophagus demonstrate no significant findings. Small hiatal hernia. Lungs/Pleura: Chronic biapical pleural-parenchymal scarring. There is diffuse bronchial thickening. Stable chronic 3 mm subpleural right upper lobe nodule on 14:32, benign given length of stability. Additional stable benign left upper lobe nodule posteriorly on 14:27. Scattered linear scar-like opacities again seen both bases and left lower lobe atelectasis alongside a slightly elevated left hemidiaphragm. There are no new or further nodules or active infiltrates. No pleural effusion or pneumothorax. Upper Abdomen: No acute abnormality. Status post cholecystectomy. Mild hepatic steatosis. Musculoskeletal: Thoracic spine kyphodextroscoliosis and degenerative changes. Mild osteopenia. No acute or other significant osseous findings. Chronically noted is a calcified collapsed left breast implant and a rim calcified intact right breast implant. No chest wall mass is seen. Review of the MIP images confirms the above findings. IMPRESSION: 1. No acute intracranial CT findings. 2. Aortic and coronary artery atherosclerosis. 3. No evidence of pulmonary arterial dilatation or embolus. 4. Bronchitis. 5. 2 stable benign lung nodules. 6. Small hiatal hernia. 7. Mild hepatic steatosis. 8. Osteopenia, kyphoscoliosis and degenerative change. Aortic Atherosclerosis (ICD10-I70.0). Electronically Signed   By: Francis Quam M.D.   On: 09/17/2023 02:15  '     Raford Lenis, MD 09/17/23 [phone]

## 2023-09-16 NOTE — ED Triage Notes (Signed)
 Pt reports Wednesday night falling asleep in an awkward position (slumped over while sitting up in bed) and the next morning/day she felt extremely sore on her left side.  Later on Thursday night she was having trouble putting her thoughts together and was advised to call EMS who noted low grade fever.  [facility] Friday and was prescribed anbx for UTI.  Subsequently they called her back today and told her she had an elevated D dimer and needed a CT scan

## 2023-09-17 ENCOUNTER — Ambulatory Visit (HOSPITAL_COMMUNITY): Admission: RE | Admit: 2023-09-17 | Source: Ambulatory Visit

## 2023-09-17 ENCOUNTER — Emergency Department (HOSPITAL_COMMUNITY)

## 2023-09-17 ENCOUNTER — Encounter (HOSPITAL_COMMUNITY): Payer: Self-pay

## 2023-09-17 DIAGNOSIS — M79662 Pain in left lower leg: Secondary | ICD-10-CM | POA: Diagnosis not present

## 2023-09-17 MED ORDER — POTASSIUM CHLORIDE CRYS ER 20 MEQ PO TBCR: 20.0000 meq | EXTENDED_RELEASE_TABLET | Freq: Two times a day (BID) | ORAL | 0 refills | Status: DC

## 2023-09-17 MED ORDER — IOHEXOL 350 MG/ML SOLN
75.0000 mL | Freq: Once | INTRAVENOUS | Status: AC | PRN
  Administered 2023-09-17: 75 mL via INTRAVENOUS

## 2023-09-17 NOTE — Discharge Instructions (Addendum)
 Your CT scan did not show any sign of a blood clot in the lung.  However, there is still concern for possible blood clot in your leg.  You need to have the vascular ultrasound done to make sure you do not have a blood clot in your leg.  Blood clots in the leg can break off and go to the lung and do not require treatment with a blood thinner.

## 2023-11-06 ENCOUNTER — Encounter: Payer: Self-pay | Admitting: Internal Medicine

## 2024-01-07 ENCOUNTER — Other Ambulatory Visit: Payer: Self-pay

## 2024-01-07 ENCOUNTER — Observation Stay (HOSPITAL_COMMUNITY)

## 2024-01-07 ENCOUNTER — Observation Stay (HOSPITAL_COMMUNITY)
Admission: EM | Admit: 2024-01-07 | Discharge: 2024-01-11 | DRG: 871 | Disposition: A | Attending: Family Medicine | Admitting: Family Medicine

## 2024-01-07 ENCOUNTER — Emergency Department (HOSPITAL_COMMUNITY)

## 2024-01-07 ENCOUNTER — Encounter (HOSPITAL_COMMUNITY): Payer: Self-pay

## 2024-01-07 DIAGNOSIS — R1011 Right upper quadrant pain: Secondary | ICD-10-CM

## 2024-01-07 DIAGNOSIS — R7989 Other specified abnormal findings of blood chemistry: Secondary | ICD-10-CM

## 2024-01-07 DIAGNOSIS — K8050 Calculus of bile duct without cholangitis or cholecystitis without obstruction: Secondary | ICD-10-CM

## 2024-01-07 DIAGNOSIS — A419 Sepsis, unspecified organism: Secondary | ICD-10-CM | POA: Diagnosis not present

## 2024-01-07 DIAGNOSIS — K8309 Other cholangitis: Secondary | ICD-10-CM

## 2024-01-07 DIAGNOSIS — R7881 Bacteremia: Secondary | ICD-10-CM

## 2024-01-07 DIAGNOSIS — J189 Pneumonia, unspecified organism: Secondary | ICD-10-CM | POA: Diagnosis not present

## 2024-01-07 DIAGNOSIS — K2960 Other gastritis without bleeding: Secondary | ICD-10-CM

## 2024-01-07 DIAGNOSIS — E876 Hypokalemia: Secondary | ICD-10-CM

## 2024-01-07 DIAGNOSIS — R410 Disorientation, unspecified: Secondary | ICD-10-CM

## 2024-01-07 DIAGNOSIS — A499 Bacterial infection, unspecified: Secondary | ICD-10-CM | POA: Diagnosis present

## 2024-01-07 LAB — URINALYSIS, W/ REFLEX TO CULTURE (INFECTION SUSPECTED)
Glucose, UA: 100 mg/dL — AB
Ketones, ur: NEGATIVE mg/dL
Nitrite: NEGATIVE
Protein, ur: 30 mg/dL — AB
Specific Gravity, Urine: 1.015 (ref 1.005–1.030)
pH: 8 (ref 5.0–8.0)

## 2024-01-07 LAB — I-STAT VENOUS BLOOD GAS, ED
Acid-Base Excess: 2 mmol/L (ref 0.0–2.0)
Bicarbonate: 25.1 mmol/L (ref 20.0–28.0)
Calcium, Ion: 1.1 mmol/L — ABNORMAL LOW (ref 1.15–1.40)
HCT: 47 % — ABNORMAL HIGH (ref 36.0–46.0)
Hemoglobin: 16 g/dL — ABNORMAL HIGH (ref 12.0–15.0)
O2 Saturation: 92 %
Potassium: 3.1 mmol/L — ABNORMAL LOW (ref 3.5–5.1)
Sodium: 136 mmol/L (ref 135–145)
TCO2: 26 mmol/L (ref 22–32)
pCO2, Ven: 34 mmHg — ABNORMAL LOW (ref 44–60)
pH, Ven: 7.476 — ABNORMAL HIGH (ref 7.25–7.43)
pO2, Ven: 59 mmHg — ABNORMAL HIGH (ref 32–45)

## 2024-01-07 LAB — CBC WITH DIFFERENTIAL/PLATELET
Abs Immature Granulocytes: 0.1 K/uL — ABNORMAL HIGH (ref 0.00–0.07)
Basophils Absolute: 0 K/uL (ref 0.0–0.1)
Basophils Relative: 0 %
Eosinophils Absolute: 0.2 K/uL (ref 0.0–0.5)
Eosinophils Relative: 1 %
HCT: 46 % (ref 36.0–46.0)
Hemoglobin: 15.6 g/dL — ABNORMAL HIGH (ref 12.0–15.0)
Immature Granulocytes: 1 %
Lymphocytes Relative: 3 %
Lymphs Abs: 0.4 K/uL — ABNORMAL LOW (ref 0.7–4.0)
MCH: 28.6 pg (ref 26.0–34.0)
MCHC: 33.9 g/dL (ref 30.0–36.0)
MCV: 84.4 fL (ref 80.0–100.0)
Monocytes Absolute: 0.5 K/uL (ref 0.1–1.0)
Monocytes Relative: 3 %
Neutro Abs: 12.8 K/uL — ABNORMAL HIGH (ref 1.7–7.7)
Neutrophils Relative %: 92 %
Platelets: 184 K/uL (ref 150–400)
RBC: 5.45 MIL/uL — ABNORMAL HIGH (ref 3.87–5.11)
RDW: 13.2 % (ref 11.5–15.5)
WBC: 13.9 K/uL — ABNORMAL HIGH (ref 4.0–10.5)
nRBC: 0 % (ref 0.0–0.2)

## 2024-01-07 LAB — COMPREHENSIVE METABOLIC PANEL WITH GFR
ALT: 77 U/L — ABNORMAL HIGH (ref 0–44)
AST: 135 U/L — ABNORMAL HIGH (ref 15–41)
Albumin: 3.4 g/dL — ABNORMAL LOW (ref 3.5–5.0)
Alkaline Phosphatase: 137 U/L — ABNORMAL HIGH (ref 38–126)
Anion gap: 15 (ref 5–15)
BUN: 12 mg/dL (ref 8–23)
CO2: 22 mmol/L (ref 22–32)
Calcium: 9.4 mg/dL (ref 8.9–10.3)
Chloride: 101 mmol/L (ref 98–111)
Creatinine, Ser: 0.97 mg/dL (ref 0.44–1.00)
GFR, Estimated: >60 mL/min
Glucose, Bld: 90 mg/dL (ref 70–99)
Potassium: 3.1 mmol/L — ABNORMAL LOW (ref 3.5–5.1)
Sodium: 138 mmol/L (ref 135–145)
Total Bilirubin: 7.5 mg/dL — ABNORMAL HIGH (ref 0.0–1.2)
Total Protein: 6.3 g/dL — ABNORMAL LOW (ref 6.5–8.1)

## 2024-01-07 LAB — HEPATITIS PANEL, ACUTE
HCV Ab: REACTIVE — AB
Hep A IgM: NONREACTIVE
Hep B C IgM: NONREACTIVE
Hepatitis B Surface Ag: NONREACTIVE

## 2024-01-07 LAB — I-STAT CHEM 8, ED
BUN: 13 mg/dL (ref 8–23)
Calcium, Ion: 1.14 mmol/L — ABNORMAL LOW (ref 1.15–1.40)
Chloride: 98 mmol/L (ref 98–111)
Creatinine, Ser: 1 mg/dL (ref 0.44–1.00)
Glucose, Bld: 96 mg/dL (ref 70–99)
HCT: 46 % (ref 36.0–46.0)
Hemoglobin: 15.6 g/dL — ABNORMAL HIGH (ref 12.0–15.0)
Potassium: 3.1 mmol/L — ABNORMAL LOW (ref 3.5–5.1)
Sodium: 138 mmol/L (ref 135–145)
TCO2: 23 mmol/L (ref 22–32)

## 2024-01-07 LAB — RAPID URINE DRUG SCREEN, HOSP PERFORMED
Amphetamines: NOT DETECTED
Barbiturates: NOT DETECTED
Benzodiazepines: NOT DETECTED
Cocaine: NOT DETECTED
Opiates: POSITIVE — AB
Tetrahydrocannabinol: NOT DETECTED

## 2024-01-07 LAB — RESPIRATORY PANEL BY PCR

## 2024-01-07 LAB — ETHANOL: Alcohol, Ethyl (B): <15 mg/dL (ref <15)

## 2024-01-07 LAB — TSH: TSH: 1.088 u[IU]/mL (ref 0.350–4.500)

## 2024-01-07 LAB — PROCALCITONIN: Procalcitonin: 4.07 ng/mL

## 2024-01-07 LAB — RESP PANEL BY RT-PCR (RSV, FLU A&B, COVID)  RVPGX2
Influenza A by PCR: NEGATIVE
Influenza B by PCR: NEGATIVE
Resp Syncytial Virus by PCR: NEGATIVE
SARS Coronavirus 2 by RT PCR: NEGATIVE

## 2024-01-07 LAB — LACTIC ACID, PLASMA: Lactic Acid, Venous: 2.4 mmol/L (ref 0.5–1.9)

## 2024-01-07 LAB — T4, FREE: Free T4: 0.78 ng/dL (ref 0.61–1.12)

## 2024-01-07 LAB — C-REACTIVE PROTEIN: CRP: 7.9 mg/dL — ABNORMAL HIGH (ref <1.0)

## 2024-01-07 LAB — AMMONIA: Ammonia: 39 umol/L — ABNORMAL HIGH (ref 9–35)

## 2024-01-07 LAB — I-STAT CG4 LACTIC ACID, ED: Lactic Acid, Venous: 2.7 mmol/L (ref 0.5–1.9)

## 2024-01-07 MED ORDER — PIPERACILLIN-TAZOBACTAM 3.375 G IVPB
3.3750 g | Freq: Three times a day (TID) | INTRAVENOUS | Status: DC
  Administered 2024-01-07 – 2024-01-10 (×9): 3.375 g via INTRAVENOUS
  Filled 2024-01-07 (×12): qty 50

## 2024-01-07 MED ORDER — SODIUM CHLORIDE 0.9% FLUSH
3.0000 mL | Freq: Two times a day (BID) | INTRAVENOUS | Status: DC
  Administered 2024-01-07 – 2024-01-11 (×8): 3 mL via INTRAVENOUS

## 2024-01-07 MED ORDER — IPRATROPIUM-ALBUTEROL 0.5-2.5 (3) MG/3ML IN SOLN: 3.0000 mL | Freq: Four times a day (QID) | RESPIRATORY_TRACT | Status: DC | PRN

## 2024-01-07 MED ORDER — ACETAMINOPHEN 325 MG PO TABS: 650.0000 mg | ORAL_TABLET | Freq: Four times a day (QID) | ORAL | Status: DC | PRN

## 2024-01-07 MED ORDER — ONDANSETRON HCL 4 MG PO TABS: 4.0000 mg | ORAL_TABLET | Freq: Four times a day (QID) | ORAL | Status: DC | PRN

## 2024-01-07 MED ORDER — POTASSIUM CHLORIDE CRYS ER 20 MEQ PO TBCR
40.0000 meq | EXTENDED_RELEASE_TABLET | Freq: Two times a day (BID) | ORAL | Status: AC
  Administered 2024-01-07 (×2): 40 meq via ORAL
  Filled 2024-01-07 (×2): qty 2

## 2024-01-07 MED ORDER — ENOXAPARIN SODIUM 40 MG/0.4ML IJ SOSY
40.0000 mg | PREFILLED_SYRINGE | INTRAMUSCULAR | Status: DC
  Administered 2024-01-07: 40 mg via SUBCUTANEOUS
  Filled 2024-01-07: qty 0.4

## 2024-01-07 MED ORDER — ONDANSETRON HCL 4 MG/2ML IJ SOLN: 4.0000 mg | Freq: Four times a day (QID) | INTRAMUSCULAR | Status: DC | PRN

## 2024-01-07 MED ORDER — ACETAMINOPHEN 650 MG RE SUPP: 650.0000 mg | Freq: Four times a day (QID) | RECTAL | Status: DC | PRN

## 2024-01-07 MED ORDER — NICOTINE 21 MG/24HR TD PT24
21.0000 mg | MEDICATED_PATCH | Freq: Every day | TRANSDERMAL | Status: DC
  Administered 2024-01-07 – 2024-01-11 (×5): 21 mg via TRANSDERMAL
  Filled 2024-01-07 (×5): qty 1

## 2024-01-07 MED ORDER — OXYCODONE HCL 5 MG PO TABS
5.0000 mg | ORAL_TABLET | Freq: Four times a day (QID) | ORAL | Status: DC | PRN
  Administered 2024-01-07 – 2024-01-08 (×2): 5 mg via ORAL
  Filled 2024-01-07 (×2): qty 1

## 2024-01-07 MED ORDER — ACETAMINOPHEN 500 MG PO TABS
1000.0000 mg | ORAL_TABLET | ORAL | Status: AC
  Administered 2024-01-07: 1000 mg via ORAL
  Filled 2024-01-07: qty 2

## 2024-01-07 MED ORDER — POTASSIUM CHLORIDE IN NACL 20-0.9 MEQ/L-% IV SOLN
INTRAVENOUS | Status: AC
  Filled 2024-01-07 (×3): qty 1000

## 2024-01-07 MED ORDER — METRONIDAZOLE 500 MG/100ML IV SOLN
500.0000 mg | Freq: Once | INTRAVENOUS | Status: DC
  Administered 2024-01-07: 500 mg via INTRAVENOUS
  Filled 2024-01-07: qty 100

## 2024-01-07 MED ORDER — SODIUM CHLORIDE 0.9 % IV SOLN
500.0000 mg | Freq: Once | INTRAVENOUS | Status: AC
  Administered 2024-01-07: 500 mg via INTRAVENOUS
  Filled 2024-01-07: qty 5

## 2024-01-07 MED ORDER — MORPHINE SULFATE ER 15 MG PO TBCR
30.0000 mg | EXTENDED_RELEASE_TABLET | Freq: Two times a day (BID) | ORAL | Status: DC
  Administered 2024-01-07 – 2024-01-11 (×9): 30 mg via ORAL
  Filled 2024-01-07 (×9): qty 2

## 2024-01-07 MED ORDER — VANCOMYCIN HCL IN DEXTROSE 1-5 GM/200ML-% IV SOLN: 1000.0000 mg | Freq: Once | INTRAVENOUS | Status: DC

## 2024-01-07 MED ORDER — ORAL CARE MOUTH RINSE: 15.0000 mL | OROMUCOSAL | Status: DC | PRN

## 2024-01-07 MED ORDER — SODIUM CHLORIDE 0.9 % IV SOLN: 1.0000 g | INTRAVENOUS | Status: DC

## 2024-01-07 MED ORDER — KETOROLAC TROMETHAMINE 15 MG/ML IJ SOLN
15.0000 mg | Freq: Once | INTRAMUSCULAR | Status: AC
  Administered 2024-01-07: 15 mg via INTRAVENOUS
  Filled 2024-01-07: qty 1

## 2024-01-07 MED ORDER — LACTATED RINGERS IV BOLUS
1000.0000 mL | Freq: Once | INTRAVENOUS | Status: AC
  Administered 2024-01-07: 1000 mL via INTRAVENOUS

## 2024-01-07 MED ORDER — SODIUM CHLORIDE 0.9 % IV SOLN
2.0000 g | Freq: Once | INTRAVENOUS | Status: AC
  Administered 2024-01-07: 2 g via INTRAVENOUS
  Filled 2024-01-07: qty 12.5

## 2024-01-07 MED ORDER — OXYCODONE HCL 5 MG PO TABS
5.0000 mg | ORAL_TABLET | ORAL | Status: AC
  Administered 2024-01-07: 5 mg via ORAL
  Filled 2024-01-07: qty 1

## 2024-01-07 MED ORDER — GADOBUTROL 1 MMOL/ML IV SOLN
6.0000 mL | Freq: Once | INTRAVENOUS | Status: AC | PRN
  Administered 2024-01-07: 6 mL via INTRAVENOUS

## 2024-01-07 NOTE — Sepsis Progress Note (Signed)
 Sepsis protocol is being followed by [facility].

## 2024-01-07 NOTE — Sepsis Progress Note (Signed)
 Notified bedside nurse of need to draw repeat lactic acid.

## 2024-01-07 NOTE — ED Notes (Signed)
 Results to matt w.rn by at lactic result

## 2024-01-07 NOTE — ED Triage Notes (Signed)
 BIB [facility]. Last night made herself some tea, boyfriend stated that she was lethargic and did not call 911. This AM he could not wake her up. Per EMS initial to painful stimuli, answers in short word sentences. Now able to respond to verbal stimuli. Tachycardic. Initial room air sat of 91%. PT states that she has been feeling sick. She will wake up when you talk to her and answer some questions but will fall asleep quickly.    Skin hot to the touch; 99.4 per EMS GCS-13 12-lead: sinus tach HR- 112bpm CBG- 131 ETCO2: 23 BP- 156/78 O2: 96% 2L O2   300LR from EMS 20G IV

## 2024-01-07 NOTE — H&P (Addendum)
 History and Physical    Patient: Molly Myers FMW:[id number] DOB: 25-Aug-1950 DOA: 01/07/2024 DOS: the patient was seen and examined on 01/07/2024 PCP: Clarice Nottingham, MD  Patient coming from: Home-lives with her significant other  Chief Complaint:  Chief Complaint  Patient presents with   Altered Mental Status   HPI: Kelee Cunningham is a 73 y.o. female with medical history significant of asthma, ongoing tobacco abuse, hypothyroidism, and chronic low back pain on chronic long-acting and short acting narcotics.  Patient was brought via EMS to the hospital after developing mental status changes that began late yesterday evening.  The boyfriend felt that the patient was lethargic but did not wish to call 911.  This morning he was unable to awaken her.  Upon initial EMS evaluation initially she was only responsive to painful stimulus and would only answer in short word sentences but eventually began to respond to verbal stimulus.  She was tachycardic and initial room air sats were 91%.  Her CBG was 131, her temperature was 99.4.  She was mildly hypertensive.  Her O2 sats were 96% on 2 L of oxygen.  She was given 300 cc of LR by EMS.  Upon evaluation in the ED she was complaining of right lower chest wall pain with inspiration as well as her chronic low back pain radiating down her right leg.  Her CT of the head was unremarkable.  Her chest x-ray revealed right lower lobe pneumonia.  Labs in the ED revealed leukocytosis as well as hypokalemia.  Lactic acid was 2.7.  Sepsis protocol initiated and patient was initially given a dose each of Maxipime, vancomycin and Flagyl.  Hospital service has been asked to evaluate the patient for admission.  Upon my evaluation of the patient she was sleepy but would awaken easily and participate in conversation although was having trouble recalling certain topics such as her medication dosages.  She is primarily complaining of pleuritic right side/right lateral chest wall  pain with inspiration.  Also with sitting up in bed or movement of right lower extremity she was having radicular pain from the back down the leg.  Her boyfriend stated she has had 2 episodes of emesis and was nauseous this morning.  No significant coughing.  No apparent sick contacts.  Confirms she has an IV contrast allergy.  Since my initial evaluation of patient and review of labs her complete metabolic panel has returned with evidence of a elevated total bilirubin of 7.5, ALT 77 and AST 135 which is concerning given her pain of possible biliary etiology.   Review of Systems: As mentioned in the history of present illness. All other systems reviewed and are negative.   Past Medical History:  Diagnosis Date   Asthma    Hypothyroidism    Past Surgical History:  Procedure Laterality Date   BREAST REDUCTION SURGERY Bilateral benign tumors   CHOLECYSTECTOMY  in the 65s   Social History:  reports that she has been smoking cigarettes. She has a 36 pack-year smoking history. She has never used smokeless tobacco. She reports that she does not drink alcohol and does not use drugs.  Allergies  Allergen Reactions   Iodinated Contrast Media Shortness Of Breath and Rash   Erythromycin Nausea And Vomiting    Family History  Problem Relation Age of Onset   Asthma Brother        childhood   Asthma Father        adult onset   Hypertension Maternal Aunt  Congestive Heart Failure Maternal Grandfather    Hypertension Mother    Congestive Heart Failure Maternal Aunt     Prior to Admission medications   Medication Sig Start Date End Date Taking? Authorizing Provider  ADVAIR DISKUS 250-50 MCG/DOSE AEPB INHALE 1 PUFF INTO THE LUNGS EVERY 12 HOURS NEED APPOINTMENT FOR FURTHER REFILLS 10/17/16   Darlean Ozell NOVAK, MD  albuterol  (PROVENTIL  HFA;VENTOLIN  HFA) 108 (90 Base) MCG/ACT inhaler Inhale 1-2 puffs into the lungs every 6 (six) hours as needed for wheezing or shortness of breath.    [provider]  albuterol  (PROVENTIL ) (2.5 MG/3ML) 0.083% nebulizer solution 1 vial in nebulizer every 6 hours as needed 04/10/15   [provider]  DHEA 10 MG TABS Take 10 mg by mouth daily.    [provider]  metaxalone (SKELAXIN) 800 MG tablet Take 800 mg by mouth every 8 (eight) hours as needed. 06/29/15   [provider]  Oxycodone  HCl 10 MG TABS Take 10 mg by mouth 4 (four) times daily as needed. For back pain 04/06/15   [provider]  potassium chloride  SA (KLOR-CON  M) 20 MEQ tablet Take 1 tablet (20 mEq total) by mouth 2 (two) times daily. 09/17/23   Raford Lenis, MD  UNABLE TO FIND Med Name: hormone cream    [provider]  XTAMPZA  ER 27 MG C12A TAKE ONE CAPSULE EVERY 12 HOURS 07/03/16   [provider]    Physical Exam: Vitals:   01/07/24 0923 01/07/24 0931 01/07/24 0936  BP:  (!) 131/54   Pulse: (!) 117    Resp:  20   Temp: (!) 101.5 F (38.6 C)    TempSrc: Oral    SpO2: 94% 94%   Weight:   58.1 kg  Height:   5' 3 (1.6 m)   Constitutional: NAD, calm, comfortable Eyes: PERRL, lids and conjunctivae normal ENMT: Mucous membranes are DRY. Neck: normal, supple, no masses, no thyromegaly Respiratory: clear to auscultation bilaterally, no wheezing, no crackles. Normal respiratory effort. No accessory muscle use.  Cardiovascular: Regular rate and rhythm, no murmurs / rubs / gallops. No extremity edema. 2+ pedal pulses. No carotid bruits.  Abdomen: Significant RUQ tenderness with palpable fullness, no masses palpated. No splenomegaly. Bowel sounds positive.  Musculoskeletal: no clubbing / cyanosis. No joint deformity upper and lower extremities. Good ROM, no contractures. Normal muscle tone.  Skin: Face is flushed, no rashes, lesions, ulcers. No induration Neurologic: CN 2-12 grossly intact. Sensation intact, Strength 5/5 x all 4 extremities.  Psychiatric: Drowsy but oriented x 3.  Somewhat anxious mood in context of ongoing back  pain and pleuritic chest pain.     Data Reviewed:  VBG: pH 7.47, pCO2 34, pO2 59, T CO2 26, HCO3 25.1 ABE 2.0  Sodium 138, potassium 3.1, chloride 101, CO2 22, glucose 90, BUN 12, creatinine 0.9, ionized calcium 1.14, alkaline phosphatase 137, AST 135, ALT 77, ammonia 39, total bilirubin 7.5  Initial lactic acid 2.7, follow-up pending, procalcitonin 4.07  WBC 13,900 with left shift neutrophils 92%, hemoglobin 15.6, platelets 184,000,  TSH and free T4 normal  Alcohol level less than 15  UDS positive for opiates which patient has been prescribed as part of her regular regimen  Blood cultures pending  CT head and chest x-ray as outlined above  Assessment and Plan: Sepsis Right lower lobe pneumonia and obstructive transaminitis Sepsis criteria: 2 sources of infection, altered mentation, fever 101 F, altered perfusion as evidenced by elevated lactic acid and persistent  tachycardia after defervesced Seems to have multifactorial etiology noting RLL pneumonia which could be more reactive in context of transaminitis and obstructive hepatitis Previously been given broad-spectrum antibiotics as above but given possible intra-abdominal infection secondary to obstructive transaminitis I have changed to Zosyn  IV Initial lactic acid elevated at 2.7 with repeat pending; procalcitonin 4.07; CRP pending Continue supportive care.  Patient appears dehydrated so we will initiate IV fluids at 125 cc/h Currently hemodynamically stable but will monitor closely in the progressive care setting. Check abdominal ultrasound.  Patient has had prior cholecystectomy and symptoms could be secondary to retained stone.  If ultrasound suggestive of retained stone/choledocholithiasis patient will need MRCP.  If no findings to suggest choledocholithiasis patient will need CT abdomen and pelvis. Check acute hepatitis panel Of note patient also has a component of pleuritic chest discomfort secondary to the pneumonia  process therefore we will give a one-time dose of IV Toradol  15 mg  Acute metabolic encephalopathy  Transient hypoxemia  Likely secondary to hypoventilation during episode of significant altered mentation as well as sepsis physiology-resolved at present Continue to monitor closely especially with resumption of patient's normal narcotics for chronic pain.  Acute hypokalemia Replace both orally and IV Repeat labs in a.m.  Asthma/ongoing tobacco abuse Currently not wheezing on exam Transient hypoxemia as above Continue to follow DuoNeb as needed If remains stable can transition over to home medications in a.m. Discussed with patient that nicotine  patch will be used to assist with potential withdrawal symptoms-she states smokes 1 pack/day  Hypothyroidism Per old records previously on [employer] Thyroid  but dose unclear Current TSH normal  Chronic low back pain Previously followed at pain clinic Currently on MS Contin  equivalent 30 mg twice daily along with Oxy IR 5 to 10 mg 4 times daily as needed Will need to monitor closely given intermittent drowsiness in setting of sepsis physiology Patient currently complaining of very severe back pain in addition to her acute right upper quadrant abdominal pain    Advance Care Planning:   Code Status: Full Code   VTE prophylaxis: Lovenox   Consults: None  Family Communication: Boyfriend at bedside  Severity of Illness: The appropriate patient status for this patient is OBSERVATION. Observation status is judged to be reasonable and necessary in order to provide the required intensity of service to ensure the patient's safety. The patient's presenting symptoms, physical exam findings, and initial radiographic and laboratory data in the context of their medical condition is felt to place them at decreased risk for further clinical deterioration. Furthermore, it is anticipated that the patient will be medically stable for discharge from the hospital  within 2 midnights of admission.   Author: Isaiah Lever, NP 01/07/2024 11:50 AM  For on call review [email or web address].

## 2024-01-07 NOTE — Plan of Care (Signed)

## 2024-01-07 NOTE — ED Notes (Signed)
 Cm 8 results to matt w.rn at

## 2024-01-07 NOTE — ED Notes (Addendum)
 BIB in [facility]. Last night made herself some tea, BF stated that she was lethargic and did not call 911. This AM he could not wake her up. Per EMS initial to painful stimuli, answers in short word sentences. Now able to respond to verbal stimuli. Tachycardic. Initial room air sat of 91%. PT states that she has been feeling sick. She will wake up when you talk to her and answer some questions but will fall asleep quickly.   Skin hot to the touch; 99.4 per EMS GCS-13 12-lead: sinus tach HR- 112bpm CBG- 131 ETCO2: 23 BP- 156/78 O2: 96% 2L O2  300LR from EMS 20G IV

## 2024-01-07 NOTE — Progress Notes (Signed)
 Pharmacy Antibiotic Note  Molly Myers is a 73 y.o. female admitted on 01/07/2024 with intra-abdominal infection.  Pharmacy has been consulted for Zosyn dosing.  Plan: Zosyn 3.375g IV q8h (4 hour infusion). Continue to monitor renal function, cultures, and opportunities to de-escalate therapy  Height: 5' 3 (160 cm) Weight: 58.1 kg (128 lb) IBW/kg (Calculated) : 52.4  Temp (24hrs), Avg:99.9 F (37.7 C), Min:98.3 F (36.8 C), Max:101.5 F (38.6 C)  Recent Labs  Lab 01/07/24 1020 01/07/24 1030 01/07/24 1031  WBC 13.9*  --   --   CREATININE 0.97 1.00  --   LATICACIDVEN  --   --  2.7*    Estimated Creatinine Clearance: 40.8 mL/min (by C-G formula based on SCr of 1 mg/dL).    Allergies  Allergen Reactions   Iodinated Contrast Media Shortness Of Breath and Rash   Erythromycin Nausea And Vomiting    Antimicrobials this admission: FEP 11/30 x1 AZM 11/30 x 1 MTZ 11/30 x1   Microbiology results: 11/30 BCx: sent 11/30 Respiratory 20 panel: sent  Thank you for allowing pharmacy to be a part of this patient's care.  R. Samual Satterfield, PharmD PGY-1 Acute Care Pharmacy Resident [facility] Please refer to [facility] for [facility] Pharmacy numbers 01/07/2024 1:32 PM

## 2024-01-07 NOTE — ED Provider Notes (Signed)
 [place] EMERGENCY DEPARTMENT AT [facility] Provider Note   CSN: [id number] Arrival date & time: 01/07/24  9082     Patient presents with: Altered Mental Status   Jocelynne Duquette is a 73 y.o. female.   73 year old female history of asthma and hypothyroidism who presents to the emergency department with altered mental status.  History obtained per boyfriend.  Reports that at 10 PM last night he noticed that she was starting to get confused.  Says that she is starting become more drowsy.  Was also having some difficulty walking and speaking.  Says that she was complaining of her right leg hurting as well.  Says that she had some sort of a hormone pellet injected in her right leg recently and had a similar reaction the last time this was done.  Patient tells me that she has had some nausea and vomiting as well as congestion and cough.  No fevers.  No headache.  No neck pain or neck stiffness.  Says that the palate was injected in September and has not been having any issues with that leg.       Prior to Admission medications   Medication Sig Start Date End Date Taking? Authorizing Provider  ADVAIR DISKUS 250-50 MCG/DOSE AEPB INHALE 1 PUFF INTO THE LUNGS EVERY 12 HOURS NEED APPOINTMENT FOR FURTHER REFILLS 10/17/16   Darlean Ozell NOVAK, MD  albuterol  (PROVENTIL  HFA;VENTOLIN  HFA) 108 (90 Base) MCG/ACT inhaler Inhale 1-2 puffs into the lungs every 6 (six) hours as needed for wheezing or shortness of breath.    [provider]  albuterol  (PROVENTIL ) (2.5 MG/3ML) 0.083% nebulizer solution 1 vial in nebulizer every 6 hours as needed 04/10/15   [provider]  DHEA 10 MG TABS Take 10 mg by mouth daily.    [provider]  metaxalone (SKELAXIN) 800 MG tablet Take 800 mg by mouth every 8 (eight) hours as needed. 06/29/15   [provider]  Oxycodone  HCl 10 MG TABS Take 10 mg by mouth 4 (four) times daily as needed. For back pain 04/06/15   [provider]  potassium chloride  SA (KLOR-CON  M) 20 MEQ tablet Take 1 tablet (20 mEq total) by mouth 2 (two) times daily. 09/17/23   Raford Lenis, MD  UNABLE TO FIND Med Name: hormone cream    [provider]  XTAMPZA  ER 27 MG C12A TAKE ONE CAPSULE EVERY 12 HOURS 07/03/16   [provider]    Allergies: Iodinated contrast media and Erythromycin    Review of Systems  Updated Vital Signs BP (!) 131/54   Pulse (!) 117   Temp (!) 101.5 F (38.6 C) (Oral)   Resp 20   Ht 5' 3 (1.6 m)   Wt 58.1 kg   SpO2 94%   BMI 22.67 kg/m   Physical Exam Vitals and nursing note reviewed.  Constitutional:      General: She is not in acute distress.    Appearance: She is well-developed.     Comments: Drowsy but is conversant and arouses easily to voice  HENT:     Head: Normocephalic and atraumatic.     Right Ear: External ear normal.     Left Ear: External ear normal.     Nose: Nose normal.  Eyes:     Extraocular Movements: Extraocular movements intact.     Conjunctiva/sclera: Conjunctivae normal.     Pupils: Pupils are equal, round, and reactive to light.  Neck:     Comments: No  meningismus Cardiovascular:     Rate and Rhythm: Regular rhythm. Tachycardia present.     Heart sounds: No murmur heard. Pulmonary:     Effort: Pulmonary effort is normal. No respiratory distress.     Comments: Diminished in right hemithorax.  Clear to auscultation in left hemithorax without any wheezing. Abdominal:     General: Abdomen is flat. There is no distension.     Palpations: Abdomen is soft. There is no mass.     Tenderness: There is abdominal tenderness (Epigastric). There is no guarding.  Musculoskeletal:     Cervical back: Normal range of motion and neck supple.     Right lower leg: No edema.     Left lower leg: No edema.     Comments: DP pulses 2+ bilaterally.  Right leg appears warm well-perfused.  Says that she had the palate injected in her gluteus on the right side and I do  not see any signs of infection or swelling.  Full range of motion of the right leg  Skin:    General: Skin is warm and dry.  Neurological:     Mental Status: She is oriented to person, place, and time.     Cranial Nerves: No cranial nerve deficit.     Sensory: No sensory deficit.     Motor: No weakness.  Psychiatric:        Mood and Affect: Mood normal.     (all labs ordered are listed, but only abnormal results are displayed) Labs Reviewed  CBC WITH DIFFERENTIAL/PLATELET - Abnormal; Notable for the following components:      Result Value   WBC 13.9 (*)    RBC 5.45 (*)    Hemoglobin 15.6 (*)    Neutro Abs 12.8 (*)    Lymphs Abs 0.4 (*)    Abs Immature Granulocytes 0.10 (*)    All other components within normal limits  AMMONIA - Abnormal; Notable for the following components:   Ammonia 39 (*)    All other components within normal limits  I-STAT CG4 LACTIC ACID, ED - Abnormal; Notable for the following components:   Lactic Acid, Venous 2.7 (*)    All other components within normal limits  I-STAT CHEM 8, ED - Abnormal; Notable for the following components:   Potassium 3.1 (*)    Calcium, Ion 1.14 (*)    Hemoglobin 15.6 (*)    All other components within normal limits  I-STAT VENOUS BLOOD GAS, ED - Abnormal; Notable for the following components:   pH, Ven 7.476 (*)    pCO2, Ven 34.0 (*)    pO2, Ven 59 (*)    Potassium 3.1 (*)    Calcium, Ion 1.10 (*)    HCT 47.0 (*)    Hemoglobin 16.0 (*)    All other components within normal limits  RESP PANEL BY RT-PCR (RSV, FLU A&B, COVID)  RVPGX2  CULTURE, BLOOD (ROUTINE X 2)  CULTURE, BLOOD (ROUTINE X 2)  RESPIRATORY PANEL BY PCR  ETHANOL  TSH  COMPREHENSIVE METABOLIC PANEL WITH GFR  URINALYSIS, W/ REFLEX TO CULTURE (INFECTION SUSPECTED)  RAPID URINE DRUG SCREEN, HOSP PERFORMED  T4, FREE  PROCALCITONIN  C-REACTIVE PROTEIN  I-STAT CG4 LACTIC ACID, ED    EKG: EKG Interpretation Date/Time:  Sunday January 07 2024 10:10:32  EST Ventricular Rate:  119 PR Interval:  163 QRS Duration:  98 QT Interval:  307 QTC Calculation: 432 R Axis:   9  Text Interpretation: Sinus tachycardia Anterior infarct, old Confirmed by  Yolande Charleston [phone]) on 01/07/2024 10:13:17 AM  Radiology: CT Head Wo Contrast Result Date: 01/07/2024 EXAM: CT HEAD WITHOUT 01/07/2024 10:47:07 AM TECHNIQUE: CT of the head was performed without the administration of intravenous contrast. Automated exposure control, iterative reconstruction, and/or weight based adjustment of the mA/kV was utilized to reduce the radiation dose to as low as reasonably achievable. COMPARISON: 09/17/2023 CLINICAL HISTORY: ams FINDINGS: BRAIN AND VENTRICLES: No acute intracranial hemorrhage. No mass effect or midline shift. No extra-axial fluid collection. No evidence of acute infarct. No hydrocephalus. ORBITS: Bilateral lens replacement. SINUSES AND MASTOIDS: No acute abnormality. SOFT TISSUES AND SKULL: No acute skull fracture. No acute soft tissue abnormality. IMPRESSION: 1. No acute intracranial abnormality. Electronically signed by: Franky Stanford MD 01/07/2024 11:06 AM EST RP Workstation: [id number]   DG Chest Port 1 View Result Date: 01/07/2024 CLINICAL DATA:  Lethargy. Decreased responsiveness. Tachycardia with decreased oxygen saturation on room air. EXAM: PORTABLE CHEST 1 VIEW COMPARISON:  CT, 09/17/2023 and older exams. FINDINGS: Cardiac silhouette is normal in size. No mediastinal or hilar masses. Streaky opacities noted at the lung bases, right greater than left. Remainder of the lungs is clear. No convincing pleural effusion or pneumothorax. Skeletal structures are demineralized, grossly intact. IMPRESSION: 1. Lung base opacities greater on the right. Right lung base opacity appears new from the CT dated 09/17/2023 and is suspicious for pneumonia. Electronically Signed   By: Alm Parkins M.D.   On: 01/07/2024 10:51     Procedures   Medications Ordered in the ED   enoxaparin (LOVENOX) injection 40 mg (has no administration in time range)  sodium chloride flush (NS) 0.9 % injection 3 mL (has no administration in time range)  acetaminophen (TYLENOL) tablet 650 mg (has no administration in time range)    Or  acetaminophen (TYLENOL) suppository 650 mg (has no administration in time range)  ondansetron (ZOFRAN) tablet 4 mg (has no administration in time range)    Or  ondansetron (ZOFRAN) injection 4 mg (has no administration in time range)  ipratropium-albuterol (DUONEB) 0.5-2.5 (3) MG/3ML nebulizer solution 3 mL (has no administration in time range)  cefTRIAXone (ROCEPHIN) 1 g in sodium chloride 0.9 % 100 mL IVPB (has no administration in time range)  azithromycin (ZITHROMAX) 500 mg in sodium chloride 0.9 % 250 mL IVPB (has no administration in time range)  0.9 % NaCl with KCl 20 mEq/ L  infusion (has no administration in time range)  ceFEPIme (MAXIPIME) 2 g in sodium chloride 0.9 % 100 mL IVPB (0 g Intravenous Stopped 01/07/24 1120)  acetaminophen (TYLENOL) tablet 1,000 mg (1,000 mg Oral Given 01/07/24 1047)  lactated ringers bolus 1,000 mL (1,000 mLs Intravenous New Bag/Given 01/07/24 1059)  oxyCODONE (Oxy IR/ROXICODONE) immediate release tablet 5 mg (5 mg Oral Given 01/07/24 1103)                                    Medical Decision Making Amount and/or Complexity of Data Reviewed Labs: ordered. Radiology: ordered.  Risk OTC drugs. Prescription drug management. Decision regarding hospitalization.   Renise Gillies is a 73 year old female history of asthma and hypothyroidism who presents to the emergency department with altered mental status.   Initial Ddx:  Sepsis, pneumonia, URI, stroke, hypercapnia, hyperammonemia, ICH, UTI, hyperthyroidism  MDM/Course:  Patient presents emergency department with altered mental status.  Also has had some congestion and cough recently.  Has been generally weak and encephalopathic per her  and her  significant other.  They reported that she got some sort of injection of a pellet in her buttocks where she has chronic pain.  On exam is febrile and tachycardic.  Has diminished breath sounds on the right side.  No focal neurologic deficits but is somewhat drowsy.  No neck rigidity or meningismus.  X-ray shows right lower lobe pneumonia.  CT head without acute abnormality.  COVID and flu negative.  Does have white blood cell count of 13.9.  Code sepsis was initiated and she was started on broad-spectrum antibiotics while we are waiting her imaging results.  Feel that she can be narrowed to community-acquired pneumonia treatment.  Lactic acid 2.7.  She was ordered fluids.  Upon re-evaluation appears improved.  Discussed with Isaiah Lever from hospitalist for admission  This patient presents to the ED for concern of complaints listed in HPI, this involves an extensive number of treatment options, and is a complaint that carries with it a high risk of complications and morbidity. Disposition including potential need for admission considered.   Dispo: Admit to Floor  Additional history obtained from significant other Records reviewed Outpatient Clinic Notes The following labs were independently interpreted: Chemistry and show hypokalemia I independently reviewed the following imaging with scope of interpretation limited to determining acute life threatening conditions related to emergency care: Chest x-ray and agree with the radiologist interpretation with the following exceptions: none I personally reviewed and interpreted cardiac monitoring: sinus tachycardia I personally reviewed and interpreted the pt's EKG: see above for interpretation  I have reviewed the patients home medications and made adjustments as needed Consults: Hospitalist Social Determinants of health:  Geriatric  Portions of this note were generated with Scientist, clinical (histocompatibility and immunogenetics). Dictation errors may occur despite best attempts at  proofreading.     Final diagnoses:  Community acquired pneumonia of right lower lobe of lung  Sepsis, due to unspecified organism, unspecified whether acute organ dysfunction present [facility])  Confusion  Hypokalemia    ED Discharge Orders     None          Yolande Lamar BROCKS, MD 01/07/24 1158

## 2024-01-07 NOTE — ED Notes (Signed)
 Vbg result to matt w.rn by at

## 2024-01-08 ENCOUNTER — Inpatient Hospital Stay (HOSPITAL_COMMUNITY)

## 2024-01-08 ENCOUNTER — Encounter (HOSPITAL_COMMUNITY): Payer: Self-pay | Admitting: Hospitalist

## 2024-01-08 ENCOUNTER — Telehealth: Payer: Self-pay

## 2024-01-08 ENCOUNTER — Inpatient Hospital Stay (HOSPITAL_COMMUNITY): Admitting: Anesthesiology

## 2024-01-08 ENCOUNTER — Other Ambulatory Visit (HOSPITAL_COMMUNITY): Payer: Self-pay

## 2024-01-08 ENCOUNTER — Encounter (HOSPITAL_COMMUNITY)
Admission: EM | Disposition: A | Discharge status: Home / Self Care | Payer: Self-pay | Source: Home / Self Care | Attending: Family Medicine

## 2024-01-08 DIAGNOSIS — K8050 Calculus of bile duct without cholangitis or cholecystitis without obstruction: Secondary | ICD-10-CM

## 2024-01-08 DIAGNOSIS — R7989 Other specified abnormal findings of blood chemistry: Secondary | ICD-10-CM

## 2024-01-08 DIAGNOSIS — J189 Pneumonia, unspecified organism: Secondary | ICD-10-CM | POA: Diagnosis present

## 2024-01-08 DIAGNOSIS — R7881 Bacteremia: Secondary | ICD-10-CM | POA: Diagnosis not present

## 2024-01-08 DIAGNOSIS — K8030 Calculus of bile duct with cholangitis, unspecified, without obstruction: Secondary | ICD-10-CM

## 2024-01-08 DIAGNOSIS — B182 Chronic viral hepatitis C: Secondary | ICD-10-CM

## 2024-01-08 DIAGNOSIS — R1011 Right upper quadrant pain: Secondary | ICD-10-CM

## 2024-01-08 DIAGNOSIS — E039 Hypothyroidism, unspecified: Secondary | ICD-10-CM | POA: Diagnosis present

## 2024-01-08 DIAGNOSIS — K2970 Gastritis, unspecified, without bleeding: Secondary | ICD-10-CM | POA: Diagnosis not present

## 2024-01-08 DIAGNOSIS — J45909 Unspecified asthma, uncomplicated: Secondary | ICD-10-CM | POA: Diagnosis not present

## 2024-01-08 DIAGNOSIS — B9620 Unspecified Escherichia coli [E. coli] as the cause of diseases classified elsewhere: Secondary | ICD-10-CM | POA: Diagnosis not present

## 2024-01-08 DIAGNOSIS — K5710 Diverticulosis of small intestine without perforation or abscess without bleeding: Secondary | ICD-10-CM

## 2024-01-08 DIAGNOSIS — K838 Other specified diseases of biliary tract: Secondary | ICD-10-CM

## 2024-01-08 DIAGNOSIS — D72829 Elevated white blood cell count, unspecified: Secondary | ICD-10-CM

## 2024-01-08 DIAGNOSIS — K2960 Other gastritis without bleeding: Secondary | ICD-10-CM

## 2024-01-08 DIAGNOSIS — B952 Enterococcus as the cause of diseases classified elsewhere: Secondary | ICD-10-CM | POA: Diagnosis not present

## 2024-01-08 DIAGNOSIS — A499 Bacterial infection, unspecified: Secondary | ICD-10-CM | POA: Diagnosis not present

## 2024-01-08 DIAGNOSIS — K8309 Other cholangitis: Secondary | ICD-10-CM

## 2024-01-08 DIAGNOSIS — M549 Dorsalgia, unspecified: Secondary | ICD-10-CM

## 2024-01-08 DIAGNOSIS — F17210 Nicotine dependence, cigarettes, uncomplicated: Secondary | ICD-10-CM | POA: Diagnosis not present

## 2024-01-08 HISTORY — PX: ERCP: SHX5425

## 2024-01-08 LAB — BLOOD CULTURE ID PANEL (REFLEXED) - BCID2
A.calcoaceticus-baumannii: NOT DETECTED
Bacteroides fragilis: NOT DETECTED
CTX-M ESBL: NOT DETECTED
Candida albicans: NOT DETECTED
Candida auris: NOT DETECTED
Candida glabrata: NOT DETECTED
Candida krusei: NOT DETECTED
Candida parapsilosis: NOT DETECTED
Candida tropicalis: NOT DETECTED
Carbapenem resist OXA 48 LIKE: NOT DETECTED
Carbapenem resistance IMP: NOT DETECTED
Carbapenem resistance KPC: NOT DETECTED
Carbapenem resistance NDM: NOT DETECTED
Carbapenem resistance VIM: NOT DETECTED
Cryptococcus neoformans/gattii: NOT DETECTED
Enterobacter cloacae complex: NOT DETECTED
Enterobacterales: DETECTED — AB
Enterococcus Faecium: DETECTED — AB
Enterococcus faecalis: NOT DETECTED
Escherichia coli: DETECTED — AB
Haemophilus influenzae: NOT DETECTED
Klebsiella aerogenes: NOT DETECTED
Klebsiella oxytoca: NOT DETECTED
Klebsiella pneumoniae: NOT DETECTED
Listeria monocytogenes: NOT DETECTED
Neisseria meningitidis: NOT DETECTED
Proteus species: NOT DETECTED
Pseudomonas aeruginosa: NOT DETECTED
Salmonella species: NOT DETECTED
Serratia marcescens: NOT DETECTED
Staphylococcus aureus (BCID): NOT DETECTED
Staphylococcus epidermidis: NOT DETECTED
Staphylococcus lugdunensis: NOT DETECTED
Staphylococcus species: NOT DETECTED
Stenotrophomonas maltophilia: NOT DETECTED
Streptococcus agalactiae: NOT DETECTED
Streptococcus pneumoniae: NOT DETECTED
Streptococcus pyogenes: NOT DETECTED
Streptococcus species: NOT DETECTED
Vancomycin resistance: NOT DETECTED

## 2024-01-08 LAB — COMPREHENSIVE METABOLIC PANEL WITH GFR
ALT: 53 U/L — ABNORMAL HIGH (ref 0–44)
AST: 77 U/L — ABNORMAL HIGH (ref 15–41)
Albumin: 2.6 g/dL — ABNORMAL LOW (ref 3.5–5.0)
Alkaline Phosphatase: 98 U/L (ref 38–126)
Anion gap: 9 (ref 5–15)
BUN: 20 mg/dL (ref 8–23)
CO2: 23 mmol/L (ref 22–32)
Calcium: 7.8 mg/dL — ABNORMAL LOW (ref 8.9–10.3)
Chloride: 106 mmol/L (ref 98–111)
Creatinine, Ser: 1.07 mg/dL — ABNORMAL HIGH (ref 0.44–1.00)
GFR, Estimated: 55 mL/min — ABNORMAL LOW (ref >60)
Glucose, Bld: 75 mg/dL (ref 70–99)
Potassium: 4.6 mmol/L (ref 3.5–5.1)
Sodium: 138 mmol/L (ref 135–145)
Total Bilirubin: 6.8 mg/dL — ABNORMAL HIGH (ref 0.0–1.2)
Total Protein: 5.3 g/dL — ABNORMAL LOW (ref 6.5–8.1)

## 2024-01-08 LAB — CBC
HCT: 40.3 % (ref 36.0–46.0)
Hemoglobin: 13.6 g/dL (ref 12.0–15.0)
MCH: 29.1 pg (ref 26.0–34.0)
MCHC: 33.7 g/dL (ref 30.0–36.0)
MCV: 86.1 fL (ref 80.0–100.0)
Platelets: 124 K/uL — ABNORMAL LOW (ref 150–400)
RBC: 4.68 MIL/uL (ref 3.87–5.11)
RDW: 13.8 % (ref 11.5–15.5)
WBC: 16 K/uL — ABNORMAL HIGH (ref 4.0–10.5)
nRBC: 0 % (ref 0.0–0.2)

## 2024-01-08 LAB — LIPASE, BLOOD: Lipase: 20 U/L (ref 11–51)

## 2024-01-08 SURGERY — ERCP, WITH INTERVENTION IF INDICATED
Anesthesia: General

## 2024-01-08 MED ORDER — FLUTICASONE FUROATE-VILANTEROL 200-25 MCG/ACT IN AEPB
1.0000 | INHALATION_SPRAY | Freq: Every day | RESPIRATORY_TRACT | Status: DC
  Administered 2024-01-11: 1 via RESPIRATORY_TRACT
  Filled 2024-01-08: qty 28

## 2024-01-08 MED ORDER — FENTANYL CITRATE (PF) 100 MCG/2ML IJ SOLN
INTRAMUSCULAR | Status: DC | PRN
  Administered 2024-01-08 (×2): 100 ug via INTRAVENOUS

## 2024-01-08 MED ORDER — FENTANYL CITRATE (PF) 100 MCG/2ML IJ SOLN
INTRAMUSCULAR | Status: AC
  Filled 2024-01-08: qty 2

## 2024-01-08 MED ORDER — DICLOFENAC SUPPOSITORY 100 MG
RECTAL | Status: DC | PRN
  Administered 2024-01-08: 100 mg via RECTAL

## 2024-01-08 MED ORDER — PROPOFOL 10 MG/ML IV BOLUS
INTRAVENOUS | Status: AC
  Filled 2024-01-08: qty 20

## 2024-01-08 MED ORDER — SODIUM CHLORIDE 0.9 % IV SOLN
INTRAVENOUS | Status: DC | PRN
  Administered 2024-01-08: 15 mL

## 2024-01-08 MED ORDER — PHENYLEPHRINE 80 MCG/ML (10ML) SYRINGE FOR IV PUSH (FOR BLOOD PRESSURE SUPPORT)
PREFILLED_SYRINGE | INTRAVENOUS | Status: DC | PRN
  Administered 2024-01-08: 120 ug via INTRAVENOUS

## 2024-01-08 MED ORDER — VANCOMYCIN HCL 750 MG/150ML IV SOLN
750.0000 mg | INTRAVENOUS | Status: DC
  Administered 2024-01-08 – 2024-01-10 (×3): 750 mg via INTRAVENOUS
  Filled 2024-01-08 (×4): qty 150

## 2024-01-08 MED ORDER — OXYCODONE HCL 5 MG PO TABS
10.0000 mg | ORAL_TABLET | Freq: Four times a day (QID) | ORAL | Status: DC | PRN
  Administered 2024-01-08 – 2024-01-10 (×5): 10 mg via ORAL
  Filled 2024-01-08 (×5): qty 2

## 2024-01-08 MED ORDER — LIDOCAINE HCL (CARDIAC) PF 100 MG/5ML IV SOSY
PREFILLED_SYRINGE | INTRAVENOUS | Status: DC | PRN
  Administered 2024-01-08: 100 mg via INTRAVENOUS

## 2024-01-08 MED ORDER — PANTOPRAZOLE SODIUM 40 MG PO TBEC
40.0000 mg | DELAYED_RELEASE_TABLET | Freq: Every day | ORAL | Status: DC
  Administered 2024-01-08 – 2024-01-11 (×4): 40 mg via ORAL
  Filled 2024-01-08 (×4): qty 1

## 2024-01-08 MED ORDER — SUGAMMADEX SODIUM 200 MG/2ML IV SOLN
INTRAVENOUS | Status: DC | PRN
  Administered 2024-01-08: 200 mg via INTRAVENOUS

## 2024-01-08 MED ORDER — VANCOMYCIN HCL 1250 MG/250ML IV SOLN
1250.0000 mg | Freq: Once | INTRAVENOUS | Status: AC
  Administered 2024-01-08: 1250 mg via INTRAVENOUS
  Filled 2024-01-08: qty 250

## 2024-01-08 MED ORDER — LACTATED RINGERS IV SOLN
INTRAVENOUS | Status: AC | PRN
  Administered 2024-01-08: 1000 mL via INTRAVENOUS

## 2024-01-08 MED ORDER — DICLOFENAC SUPPOSITORY 100 MG
RECTAL | Status: AC
  Filled 2024-01-08: qty 1

## 2024-01-08 MED ORDER — GLUCAGON HCL RDNA (DIAGNOSTIC) 1 MG IJ SOLR
INTRAMUSCULAR | Status: DC | PRN
  Administered 2024-01-08: .25 mg via INTRAVENOUS

## 2024-01-08 MED ORDER — DEXAMETHASONE SOD PHOSPHATE PF 10 MG/ML IJ SOLN
INTRAMUSCULAR | Status: DC | PRN
  Administered 2024-01-08: 10 mg via INTRAVENOUS

## 2024-01-08 MED ORDER — GLUCAGON HCL RDNA (DIAGNOSTIC) 1 MG IJ SOLR
INTRAMUSCULAR | Status: AC
  Filled 2024-01-08: qty 1

## 2024-01-08 MED ORDER — ONDANSETRON HCL 4 MG/2ML IJ SOLN
INTRAMUSCULAR | Status: DC | PRN
  Administered 2024-01-08: 4 mg via INTRAVENOUS

## 2024-01-08 MED ORDER — PROPOFOL 10 MG/ML IV BOLUS
INTRAVENOUS | Status: DC | PRN
  Administered 2024-01-08: 120 mg via INTRAVENOUS

## 2024-01-08 MED ORDER — ROCURONIUM BROMIDE 100 MG/10ML IV SOLN
INTRAVENOUS | Status: DC | PRN
  Administered 2024-01-08: 50 mg via INTRAVENOUS

## 2024-01-08 NOTE — Progress Notes (Signed)
 PROGRESS NOTE    Molly Myers  FMW:[id number] DOB: 10-22-1950 DOA: 01/07/2024 PCP: Clarice Nottingham, MD  Chief Complaint  Patient presents with   Altered Mental Status    Brief Narrative:   73 yo with hx cholecystectomy, asthma, tobacco abuse, hypothyroidism, chronic back pain who presented with altered mental status and was found to have sepsis in the setting of cholangitis and pneumonia.    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 CXR with R lung base opacities LFT's notable for elevated bili (6.8), elevated AST/ALT Noted leukocytosis  Blood cultures with gram positive cocci and gram negative rods  Acute Metabolic Encephalopathy Resolved today Delirium precautions - due to above  Asthma No symptoms Continue nebs  Hypothyroidism  TSH is wnl Unclear dose   Thrombocytopenia Mild, noted   Chronic Pain  Continue home pain meds - caution with encephalopathy above   Tobacco Abuse Noted, encourage cessation     DVT prophylaxis: SCD Code Status: full Family Communication: sig other at bedside Disposition:   Status is: Observation The patient remains OBS appropriate and will d/c before 2 midnights.   Consultants:  GI  Procedures:  none  Antimicrobials:  Anti-infectives (From admission, onward)    Start     Dose/Rate Route Frequency Ordered Stop   01/08/24 2200  vancomycin (VANCOREADY) IVPB 750 mg/150 mL        750 mg 150 mL/hr over 60 Minutes Intravenous Every 24 hours 01/08/24 0244     01/08/24 0300  vancomycin (VANCOREADY) IVPB 1250 mg/250 mL        1,250 mg 166.7 mL/hr over 90 Minutes Intravenous  Once 01/08/24 0241  01/08/24 0507   01/07/24 2300  cefTRIAXone (ROCEPHIN) 1 g in sodium chloride 0.9 % 100 mL IVPB  Status:  Discontinued        1 g 200 mL/hr over 30 Minutes Intravenous Every 24 hours 01/07/24 1152 01/07/24 1328   01/07/24 1430  piperacillin-tazobactam (ZOSYN) IVPB 3.375 g        3.375 g 12.5 mL/hr over 240 Minutes Intravenous Every 8 hours 01/07/24 1330     01/07/24 1200  azithromycin (ZITHROMAX) 500 mg in sodium chloride 0.9 % 250 mL IVPB        500 mg 250 mL/hr over 60 Minutes Intravenous  Once 01/07/24 1152 01/07/24 1318   01/07/24 1000  ceFEPIme (MAXIPIME) 2 g in sodium chloride 0.9 % 100 mL IVPB        2 g 200 mL/hr over 30 Minutes Intravenous  Once 01/07/24 0947 01/07/24 1120   01/07/24 1000  metroNIDAZOLE (FLAGYL) IVPB 500 mg  Status:  Discontinued        500 mg 100 mL/hr over 60 Minutes Intravenous  Once 01/07/24 0947 01/07/24 1155   01/07/24 1000  vancomycin (VANCOCIN) IVPB 1000 mg/200 mL premix  Status:  Discontinued        1,000 mg 200 mL/hr over 60 Minutes Intravenous  Once 01/07/24 0947 01/07/24 1151       Subjective: Sig other at bedside Continued abdominal pain Asking for her chronic home pain meds  Objective: Vitals:   01/07/24 1658 01/07/24 1941 01/08/24 0426 01/08/24 0805  BP: (!) 107/57 107/65 (!) 108/54 (!) 124/55  Pulse: (!) 103 100 84 90  Resp: 16  18 (!) 24  Temp: 97.7 F (36.5 C) 98.6 F (37 C) 97.8 F (36.6 C) 98.2 F (36.8 C)  TempSrc: Oral Oral Oral Oral  SpO2: 94% 96% 98% 98%  Weight:      Height:        Intake/Output Summary (Last 24 hours) at 01/08/2024 1149 Last data filed at 01/08/2024 0806 Gross per 24 hour  Intake 1157.04 ml  Output 400 ml  Net 757.04 ml   Filed Weights   01/07/24 0936  Weight: 58.1 kg    Examination:  General exam: Appears calm and comfortable  Respiratory system: Clear to auscultation. Respiratory effort normal. Cardiovascular system: RRR Gastrointestinal system: diffuse abdominal TTP sounds  heard. Central nervous system: Alert and oriented. No focal neurological deficits. Extremities: no LEE    Data Reviewed: I have personally reviewed following labs and imaging studies  CBC: Recent Labs  Lab 01/07/24 1020 01/07/24 1030 01/07/24 1031 01/08/24 0632  WBC 13.9*  --   --  16.0*  NEUTROABS 12.8*  --   --   --   HGB 15.6* 15.6* 16.0* 13.6  HCT 46.0 46.0 47.0* 40.3  MCV 84.4  --   --  86.1  PLT 184  --   --  124*    Basic Metabolic Panel: Recent Labs  Lab 01/07/24 1020 01/07/24 1030 01/07/24 1031 01/08/24 0632  NA 138 138 136 138  K 3.1* 3.1* 3.1* 4.6  CL 101 98  --  106  CO2 22  --   --  23  GLUCOSE 90 96  --  75  BUN 12 13  --  20  CREATININE 0.97 1.00  --  1.07*  CALCIUM 9.4  --   --  7.8*    GFR: Estimated Creatinine Clearance: 38.2 mL/min (Edeline Greening) (by C-G formula based on SCr of 1.07 mg/dL (H)).  Liver Function Tests: Recent Labs  Lab 01/07/24 1020 01/08/24 0632  AST 135* 77*  ALT 77* 53*  ALKPHOS 137* 98  BILITOT 7.5* 6.8*  PROT 6.3* 5.3*  ALBUMIN 3.4* 2.6*    CBG: No results for input(s): GLUCAP in the last 168 hours.   Recent Results (from the past 240 hours)  Resp panel by RT-PCR (RSV, Flu Exzavier Ruderman&B, Covid) Anterior Nasal Swab     Status: None   Collection Time: 01/07/24  9:47 AM   Specimen: Anterior Nasal Swab  Result Value Ref Range Status   SARS Coronavirus 2 by RT PCR NEGATIVE NEGATIVE Final   Influenza Sherilynn Dieu by PCR NEGATIVE NEGATIVE Final   Influenza B by PCR NEGATIVE NEGATIVE Final    Comment: (NOTE) The Xpert Xpress SARS-CoV-2/FLU/RSV plus assay is intended as an aid in the diagnosis of influenza from Nasopharyngeal swab specimens and should not be used as [place] sole basis for treatment. Nasal washings and aspirates are unacceptable for Xpert Xpress SARS-CoV-2/FLU/RSV testing.  Fact Sheet for Patients: bloggercourse.com  Fact Sheet for Healthcare Providers: seriousbroker.it  This  test is not yet approved or cleared by the [place]  FDA and has been authorized for detection and/or diagnosis of SARS-CoV-2 by FDA under an Emergency Use Authorization (EUA). This EUA will remain in effect (meaning this test can be used) for the duration of the COVID-19 declaration under Section 564(b)(1) of the Act, 21 U.S.C. section 360bbb-3(b)(1), unless the authorization is terminated or revoked.     Resp Syncytial Virus by  PCR NEGATIVE NEGATIVE Final    Comment: (NOTE) Fact Sheet for Patients: bloggercourse.com  Fact Sheet for Healthcare Providers: seriousbroker.it  This test is not yet approved or cleared by the [place]  FDA and has been authorized for detection and/or diagnosis of SARS-CoV-2 by FDA under an Emergency Use Authorization (EUA). This EUA will remain in effect (meaning this test can be used) for the duration of the COVID-19 declaration under Section 564(b)(1) of the Act, 21 U.S.C. section 360bbb-3(b)(1), unless the authorization is terminated or revoked.  Performed at [facility], [address], [place], [place] [address]   Blood Culture (routine x 2)     Status: None (Preliminary result)   Collection Time: 01/07/24  9:47 AM   Specimen: BLOOD LEFT ARM  Result Value Ref Range Status   Specimen Description BLOOD LEFT ARM  Final   Special Requests   Final    BOTTLES DRAWN AEROBIC AND ANAEROBIC Blood Culture adequate volume   Culture  Setup Time   Final    GRAM NEGATIVE RODS GRAM POSITIVE COCCI IN PAIRS IN CHAINS IN BOTH AEROBIC AND ANAEROBIC BOTTLES CRITICAL RESULT CALLED TO, READ BACK BY AND VERIFIED WITH: PHARMD J LEDFORD 01/08/2024 @ 0229 BY AB    Culture   Final    GRAM NEGATIVE RODS GRAM POSITIVE COCCI TOO YOUNG TO READ Performed at [facility], [address], [place], [place] [address]    Report Status PENDING  Incomplete  Blood Culture ID Panel (Reflexed)     Status:  Abnormal   Collection Time: 01/07/24  9:47 AM  Result Value Ref Range Status   Enterococcus faecalis NOT DETECTED NOT DETECTED Final   Enterococcus Faecium DETECTED (Malayna Noori) NOT DETECTED Final    Comment: CRITICAL RESULT CALLED TO, READ BACK BY AND VERIFIED WITH: PHARMD J LEDFORD 01/08/2024 @ 0229 BY AB    Listeria monocytogenes NOT DETECTED NOT DETECTED Final   Staphylococcus species NOT DETECTED NOT DETECTED Final   Staphylococcus aureus (BCID) NOT DETECTED NOT DETECTED Final   Staphylococcus epidermidis NOT DETECTED NOT DETECTED Final   Staphylococcus lugdunensis NOT DETECTED NOT DETECTED Final   Streptococcus species NOT DETECTED NOT DETECTED Final   Streptococcus agalactiae NOT DETECTED NOT DETECTED Final   Streptococcus pneumoniae NOT DETECTED NOT DETECTED Final   Streptococcus pyogenes NOT DETECTED NOT DETECTED Final   Ariani Seier.calcoaceticus-baumannii NOT DETECTED NOT DETECTED Final   Bacteroides fragilis NOT DETECTED NOT DETECTED Final   Enterobacterales DETECTED (Kyrstan Gotwalt) NOT DETECTED Final    Comment: Enterobacterales represent Chablis Losh large order of gram negative bacteria, not Nataliyah Packham single organism. CRITICAL RESULT CALLED TO, READ BACK BY AND VERIFIED WITH: PHARMD J LEDFORD 01/08/2024 @ 0229 BY AB    Enterobacter cloacae complex NOT DETECTED NOT DETECTED Final   Escherichia coli DETECTED (Alysson Geist) NOT DETECTED Final    Comment: CRITICAL RESULT CALLED TO, READ BACK BY AND VERIFIED WITH: PHARMD J LEDFORD 01/08/2024 @ 0229 BY AB    Klebsiella aerogenes NOT DETECTED NOT DETECTED Final   Klebsiella oxytoca NOT DETECTED NOT DETECTED Final   Klebsiella pneumoniae NOT DETECTED NOT DETECTED Final   Proteus species NOT DETECTED NOT DETECTED Final   Salmonella species NOT DETECTED NOT DETECTED Final   Serratia marcescens NOT DETECTED NOT DETECTED Final   Haemophilus influenzae NOT DETECTED NOT DETECTED Final   Neisseria meningitidis NOT DETECTED NOT DETECTED Final   Pseudomonas aeruginosa NOT DETECTED NOT DETECTED  Final   Stenotrophomonas maltophilia NOT DETECTED NOT DETECTED Final   Candida  albicans NOT DETECTED NOT DETECTED Final   Candida auris NOT DETECTED NOT DETECTED Final   Candida glabrata NOT DETECTED NOT DETECTED Final   Candida krusei NOT DETECTED NOT DETECTED Final   Candida parapsilosis NOT DETECTED NOT DETECTED Final   Candida tropicalis NOT DETECTED NOT DETECTED Final   Cryptococcus neoformans/gattii NOT DETECTED NOT DETECTED Final   CTX-M ESBL NOT DETECTED NOT DETECTED Final   Carbapenem resistance IMP NOT DETECTED NOT DETECTED Final   Carbapenem resistance KPC NOT DETECTED NOT DETECTED Final   Carbapenem resistance NDM NOT DETECTED NOT DETECTED Final   Carbapenem resist OXA 48 LIKE NOT DETECTED NOT DETECTED Final   Vancomycin resistance NOT DETECTED NOT DETECTED Final   Carbapenem resistance VIM NOT DETECTED NOT DETECTED Final    Comment: Performed at [facility], [address] [place], [place] [address]  Blood Culture (routine x 2)     Status: None (Preliminary result)   Collection Time: 01/07/24  9:52 AM   Specimen: BLOOD RIGHT ARM  Result Value Ref Range Status   Specimen Description BLOOD RIGHT ARM  Final   Special Requests   Final    BOTTLES DRAWN AEROBIC AND ANAEROBIC Blood Culture adequate volume   Culture  Setup Time   Final    GRAM NEGATIVE RODS IN BOTH AEROBIC AND ANAEROBIC BOTTLES GRAM POSITIVE COCCI IN PAIRS IN CHAINS AEROBIC BOTTLE ONLY    Culture   Final    GRAM NEGATIVE RODS GRAM POSITIVE COCCI TOO YOUNG TO READ Performed at [facility], [address] [place], [place] [address]    Report Status PENDING  Incomplete  Respiratory (~20 pathogens) panel by PCR     Status: None   Collection Time: 01/07/24  8:46 PM  Result Value Ref Range Status   Adenovirus NOT DETECTED NOT DETECTED Final   Coronavirus 229E NOT DETECTED NOT DETECTED Final    Comment: (NOTE) The Coronavirus on the Respiratory Panel, DOES NOT test for the novel  Coronavirus  (2019 nCoV)    Coronavirus HKU1 NOT DETECTED NOT DETECTED Final   Coronavirus NL63 NOT DETECTED NOT DETECTED Final   Coronavirus OC43 NOT DETECTED NOT DETECTED Final   Metapneumovirus NOT DETECTED NOT DETECTED Final   Rhinovirus / Enterovirus NOT DETECTED NOT DETECTED Final   Influenza Darean Rote NOT DETECTED NOT DETECTED Final   Influenza B NOT DETECTED NOT DETECTED Final   Parainfluenza Virus 1 NOT DETECTED NOT DETECTED Final   Parainfluenza Virus 2 NOT DETECTED NOT DETECTED Final   Parainfluenza Virus 3 NOT DETECTED NOT DETECTED Final   Parainfluenza Virus 4 NOT DETECTED NOT DETECTED Final   Respiratory Syncytial Virus NOT DETECTED NOT DETECTED Final   Bordetella pertussis NOT DETECTED NOT DETECTED Final   Bordetella Parapertussis NOT DETECTED NOT DETECTED Final   Chlamydophila pneumoniae NOT DETECTED NOT DETECTED Final   Mycoplasma pneumoniae NOT DETECTED NOT DETECTED Final    Comment: Performed at [facility], [address] [place], [place] [address]         Radiology Studies: MR ABDOMEN MRCP W WO CONTAST Result Date: 01/08/2024 EXAM: MRCP WITHOUT AND WITH IV CONTRAST 01/07/2024 10:41:25 PM TECHNIQUE: Multisequence, multiplanar magnetic resonance images of the abdomen without and with intravenous contrast. MRCP sequences were performed. CONTRAST: 6 mL of GADAVIST. COMPARISON: US  Abdomen limited 01/07/2024 3:59:47 PM. CLINICAL HISTORY: Biliary obstruction suspected (Ped 0-17y). FINDINGS: LIVER: No suspicious focal liver lesion identified. Trace fluid along the inferior margin of right lobe of liver. GALLBLADDER  AND BILIARY SYSTEM: Status post cholecystectomy. The common bile duct is dilated measuring up to 1.6 cm. There are numerous stones filling the mid and distal common bile duct which measure up to 8 mm. Moderate intrahepatic bile duct dilatation. SPLEEN: The spleen is within normal limits in size and appearance. PANCREAS/PANCREATIC DUCT: Possible edema involving the head and  uncinate process of the pancreas identified with Devyne Hauger small volume of free fluid along the pancreaticoduodenal groove. No pancreatic ductal dilatation. ADRENAL GLANDS: Normal size and morphology bilaterally. No nodule, thickening, or hemorrhage. No periadrenal stranding. KIDNEYS: Bilateral perinephric soft tissue stranding, nonspecific. LYMPH NODES: Upper limits of normal lymph nodes within the upper abdomen, likely reactive. VASCULATURE: Unremarkable. PERITONEUM: No ascites. ABDOMINAL WALL: No hernia. No mass. BOWEL: Grossly unremarkable. No bowel obstruction. BONES: Lumbar scoliosis with convexity towards the left. No acute abnormality or worrisome osseous lesion. SOFT TISSUES: Unremarkable. MISCELLANEOUS: Subsegmental atelectasis identified within the lung bases. IMPRESSION: 1. Status post cholecystectomy. Choledocholithiasis with dilated common bile duct measuring up to 1.6 cm and stones up to 8 mm in the mid to distal duct. 2. 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 Electronically signed by: Waddell Calk MD 01/08/2024 05:19 AM EST RP Workstation: [id number]   MR 3D Recon At Scanner Result Date: 01/08/2024 EXAM: MRCP WITHOUT AND WITH IV CONTRAST 01/07/2024 10:41:25 PM TECHNIQUE: Multisequence, multiplanar magnetic resonance images of the abdomen without and with intravenous contrast. MRCP sequences were performed. CONTRAST: 6 mL of GADAVIST. COMPARISON: US  Abdomen limited 01/07/2024 3:59:47 PM. CLINICAL HISTORY: Biliary obstruction suspected (Ped 0-17y). FINDINGS: LIVER: No suspicious focal liver lesion identified. Trace fluid along the inferior margin of right lobe of liver. GALLBLADDER AND BILIARY SYSTEM: Status post cholecystectomy. The common bile duct is dilated measuring up to 1.6 cm. There are numerous stones filling the mid and distal common bile duct which measure up to 8 mm. Moderate intrahepatic  bile duct dilatation. SPLEEN: The spleen is within normal limits in size and appearance. PANCREAS/PANCREATIC DUCT: Possible edema involving the head and uncinate process of the pancreas identified with Bryann Gentz small volume of free fluid along the pancreaticoduodenal groove. No pancreatic ductal dilatation. ADRENAL GLANDS: Normal size and morphology bilaterally. No nodule, thickening, or hemorrhage. No periadrenal stranding. KIDNEYS: Bilateral perinephric soft tissue stranding, nonspecific. LYMPH NODES: Upper limits of normal lymph nodes within the upper abdomen, likely reactive. VASCULATURE: Unremarkable. PERITONEUM: No ascites. ABDOMINAL WALL: No hernia. No mass. BOWEL: Grossly unremarkable. No bowel obstruction. BONES: Lumbar scoliosis with convexity towards the left. No acute abnormality or worrisome osseous lesion. SOFT TISSUES: Unremarkable. MISCELLANEOUS: Subsegmental atelectasis identified within the lung bases. IMPRESSION: 1. Status post cholecystectomy. Choledocholithiasis with dilated common bile duct measuring up to 1.6 cm and stones up to 8 mm in the mid to distal duct. 2. 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 Electronically signed by: Waddell Calk MD 01/08/2024 05:19 AM EST RP Workstation: [id number]   US  Abdomen Limited RUQ (LIVER/GB) Result Date: 01/07/2024 CLINICAL DATA:  Right upper quadrant abdominal pain. Obstructive transaminitis. History of cholecystectomy. EXAM: ULTRASOUND ABDOMEN LIMITED RIGHT UPPER QUADRANT COMPARISON:  Ultrasound 07/31/2023.  CT 09/17/2023. FINDINGS: Gallbladder: Surgically absent.  No Murphy sign in the region. Common bile duct: Diameter: Dilated biliary ductal tree with the common duct measuring 13.8 mm. Intrahepatic ductal dilatation present as well. Liver: Heterogeneous echotexture without definite focal lesion. Findings probably relate to steatosis. Portal vein is  patent on color Doppler imaging with normal direction of blood flow towards the liver. Other: None. IMPRESSION: 1. Previous cholecystectomy. Dilated biliary ductal tree with the common duct measuring 13.8 mm. Intrahepatic ductal dilatation present as well. Similar to prior recent examinations. This ductal dilatation could be due to postsurgical dilatation in an elderly patient, but the possibility of Shakaria Raphael distal common duct obstruction is not excluded. 2. Heterogeneous echotexture of the liver probably related to steatosis. Electronically Signed   By: Oneil Officer M.D.   On: 01/07/2024 16:47   CT Head Wo Contrast Result Date: 01/07/2024 EXAM: CT HEAD WITHOUT 01/07/2024 10:47:07 AM TECHNIQUE: CT of the head was performed without the administration of intravenous contrast. Automated exposure control, iterative reconstruction, and/or weight based adjustment of the mA/kV was utilized to reduce the radiation dose to as low as reasonably achievable. COMPARISON: 09/17/2023 CLINICAL HISTORY: ams FINDINGS: BRAIN AND VENTRICLES: No acute intracranial hemorrhage. No mass effect or midline shift. No extra-axial fluid collection. No evidence of acute infarct. No hydrocephalus. ORBITS: Bilateral lens replacement. SINUSES AND MASTOIDS: No acute abnormality. SOFT TISSUES AND SKULL: No acute skull fracture. No acute soft tissue abnormality. IMPRESSION: 1. No acute intracranial abnormality. Electronically signed by: Franky Stanford MD 01/07/2024 11:06 AM EST RP Workstation: [id number]   DG Chest Port 1 View Result Date: 01/07/2024 CLINICAL DATA:  Lethargy. Decreased responsiveness. Tachycardia with decreased oxygen saturation on room air. EXAM: PORTABLE CHEST 1 VIEW COMPARISON:  CT, 09/17/2023 and older exams. FINDINGS: Cardiac silhouette is normal in size. No mediastinal or hilar masses. Streaky opacities noted at the lung bases, right greater than left. Remainder of the lungs is clear. No convincing pleural effusion or  pneumothorax. Skeletal structures are demineralized, grossly intact. IMPRESSION: 1. Lung base opacities greater on the right. Right lung base opacity appears new from the CT dated 09/17/2023 and is suspicious for pneumonia. Electronically Signed   By: Alm Parkins M.D.   On: 01/07/2024 10:51        Scheduled Meds:  morphine  30 mg Oral Q12H   nicotine  21 mg Transdermal Daily   sodium chloride flush  3 mL Intravenous Q12H   Continuous Infusions:  0.9 % NaCl with KCl 20 mEq / L 125 mL/hr at 01/08/24 0839   piperacillin-tazobactam (ZOSYN)  IV 3.375 g (01/08/24 0607)   vancomycin       LOS: 0 days    Time spent: over 30 min     Meliton Monte, MD [facility]   To contact the attending provider between 7A-7P or the covering provider during after hours 7P-7A, please log into the web site [email or web address] and access using universal [place] password for that web site. If you do not have the password, please call the hospital operator.  01/08/2024, 11:49 AM

## 2024-01-08 NOTE — TOC Initial Note (Signed)
 Transition of Care [facility]) - Initial/Assessment Note    Patient Details  Name: Molly Myers MRN: [id number] Date of Birth: 11-24-1950  Transition of [facility]) CM/SW Contact:    Sudie Erminio Deems, RN Phone Number: 01/08/2024, 11:33 AM  Clinical Narrative: Patient presented for altered mental status. PTA patient was independent from home with significant other. Patient reports that she does not use any DME in the home. Patient has insurance and PCP.  ICM will continue to follow for additional disposition needs as the patient progresses.               Expected Discharge Plan: Home/Self Care Barriers to Discharge: Continued Medical Work up   Patient Goals and CMS Choice Patient states their goals for this hospitalization and ongoing recovery are:: Plans to return home onces stable.   Choice offered to / list presented to : NA      Expected Discharge Plan and Services In-house Referral: NA Discharge Planning Services: CM Consult Post Acute Care Choice: NA Living arrangements for the past 2 months: Single Family Home                   DME Agency: NA       HH Arranged: NA  Prior Living Arrangements/Services Living arrangements for the past 2 months: Single Family Home Lives with:: Significant Other Patient language and need for interpreter reviewed:: Yes Do you feel safe going back to the place where you live?: Yes      Need for Family Participation in Patient Care: No (Comment) Care giver support system in place?: No (comment)   Criminal Activity/Legal Involvement Pertinent to Current Situation/Hospitalization: No - Comment as needed  Activities of Daily Living   ADL Screening (condition at time of admission) Independently performs ADLs?: Yes (appropriate for developmental age) Is the patient deaf or have difficulty hearing?: No Does the patient have difficulty seeing, even when wearing glasses/contacts?: No Does the patient have difficulty concentrating,  remembering, or making decisions?: No  Permission Sought/Granted Permission sought to share information with : Family Supports, Case Manager     Emotional Assessment Appearance:: Appears stated age Attitude/Demeanor/Rapport: Engaged Affect (typically observed): Appropriate Orientation: : Oriented to Self, Oriented to Place, Oriented to  Time, Oriented to Situation Alcohol / Substance Use: Not Applicable Psych Involvement: No (comment)  Admission diagnosis:  Hypokalemia [E87.6] Confusion [R41.0] Sepsis due to pneumonia (HCC) [J18.9, A41.9] Community acquired pneumonia of right lower lobe of lung [J18.9] Sepsis, due to unspecified organism, unspecified whether acute organ dysfunction present [facility]) [A41.9] Patient Active Problem List   Diagnosis Date Noted   Sepsis due to pneumonia (HCC) 01/07/2024   Asthma 07/24/2015   PCP:  Clarice Nottingham, MD Pharmacy:   [facility] [phone] - [place], [place] - [address] AT [address] [facility] [facility] [address] [place] [address] Phone: [phone] Fax: [phone]     Social Drivers of Health (SDOH) Social History: SDOH Screenings   Food Insecurity: No Food Insecurity (01/07/2024)  Housing: Low Risk  (01/07/2024)  Transportation Needs: No Transportation Needs (01/07/2024)  Utilities: Not At Risk (01/07/2024)  Social Connections: Moderately Isolated (01/07/2024)  Tobacco Use: High Risk (01/07/2024)   SDOH Interventions:     Readmission Risk Interventions     No data to display

## 2024-01-08 NOTE — Anesthesia Procedure Notes (Signed)
 Procedure Name: Intubation Date/Time: 01/08/2024 2:39 PM  Performed by: Judythe Tanda Aran, CRNAPre-anesthesia Checklist: Patient identified, Emergency Drugs available, Suction available and Patient being monitored Patient Re-evaluated:Patient Re-evaluated prior to induction Oxygen Delivery Method: Circle system utilized Preoxygenation: Pre-oxygenation with 100% oxygen Induction Type: IV induction Ventilation: Mask ventilation without difficulty Laryngoscope Size: Mac and 4 Grade View: Grade I Tube type: Oral Tube size: 7.0 mm Number of attempts: 1 Airway Equipment and Method: Stylet Placement Confirmation: ETT inserted through vocal cords under direct vision, positive ETCO2 and breath sounds checked- equal and bilateral Secured at: 20 cm Tube secured with: Tape Dental Injury: Teeth and Oropharynx as per pre-operative assessment

## 2024-01-08 NOTE — Anesthesia Preprocedure Evaluation (Addendum)
 Anesthesia Evaluation  Patient identified by MRN, date of birth, ID band Patient awake    Reviewed: Allergy & Precautions, NPO status , Patient's Chart, lab work & pertinent test results, reviewed documented beta blocker date and time   History of Anesthesia Complications Negative for: history of anesthetic complications  Airway Mallampati: II  TM Distance: >3 FB     Dental no notable dental hx.    Pulmonary asthma , pneumonia, Current Smoker IMPRESSION: 1. Lung base opacities greater on the right. Right lung base opacity appears new from the CT dated 09/17/2023 and is suspicious for pneumonia.    breath sounds clear to auscultation       Cardiovascular  Rhythm:Regular Rate:Normal  FINDINGS: CORONARY CALCIUM SCORES:   Left Main: 95.5   LAD: 294   LCx: 112   RCA: 179   Total Agatston Score: 681   MESA database percentile: 93    Neuro/Psych neg Seizures    GI/Hepatic PUD,,,(+)     substance abuse    Endo/Other  Hypothyroidism    Renal/GU      Musculoskeletal  (+) Arthritis ,  narcotic dependent  Abdominal   Peds  Hematology   Anesthesia Other Findings   Reproductive/Obstetrics                              Anesthesia Physical Anesthesia Plan  ASA: 3  Anesthesia Plan: General   Post-op Pain Management:    Induction: Intravenous  PONV Risk Score and Plan: 2 and Ondansetron and Dexamethasone  Airway Management Planned: Oral ETT  Additional Equipment:   Intra-op Plan:   Post-operative Plan:   Informed Consent: I have reviewed the patients History and Physical, chart, labs and discussed the procedure including the risks, benefits and alternatives for the proposed anesthesia with the patient or authorized representative who has indicated his/her understanding and acceptance.     Dental advisory given  Plan Discussed with: CRNA  Anesthesia Plan Comments:           Anesthesia Quick Evaluation

## 2024-01-08 NOTE — Plan of Care (Signed)

## 2024-01-08 NOTE — Progress Notes (Signed)
 PHARMACY - PHYSICIAN COMMUNICATION CRITICAL VALUE ALERT - BLOOD CULTURE IDENTIFICATION (BCID)  Molly Myers is an 73 y.o. female who presented to [facility] on 01/07/2024 with a chief complaint of PNA/intra-abdominal infection  Name of physician (or Provider) Contacted: Dr. Franky  Current antibiotics: Zosyn  Changes to prescribed antibiotics recommended:  Cont Zosyn for E Coli and intra-abdominal coverage Add vancomycin for enterococcal coverage Vancomycin 1250 mg IV x 1, then 750 mg IV q24h >>>Estimated AUC: 469 Trend WBC, temp, renal function  F/U infectious work-up Drug levels as indicated   Results for orders placed or performed during the hospital encounter of 01/07/24  Blood Culture ID Panel (Reflexed) (Collected: 01/07/2024  9:47 AM)  Result Value Ref Range   Enterococcus faecalis NOT DETECTED NOT DETECTED   Enterococcus Faecium DETECTED (A) NOT DETECTED   Listeria monocytogenes NOT DETECTED NOT DETECTED   Staphylococcus species NOT DETECTED NOT DETECTED   Staphylococcus aureus (BCID) NOT DETECTED NOT DETECTED   Staphylococcus epidermidis NOT DETECTED NOT DETECTED   Staphylococcus lugdunensis NOT DETECTED NOT DETECTED   Streptococcus species NOT DETECTED NOT DETECTED   Streptococcus agalactiae NOT DETECTED NOT DETECTED   Streptococcus pneumoniae NOT DETECTED NOT DETECTED   Streptococcus pyogenes NOT DETECTED NOT DETECTED   A.calcoaceticus-baumannii NOT DETECTED NOT DETECTED   Bacteroides fragilis NOT DETECTED NOT DETECTED   Enterobacterales DETECTED (A) NOT DETECTED   Enterobacter cloacae complex NOT DETECTED NOT DETECTED   Escherichia coli DETECTED (A) NOT DETECTED   Klebsiella aerogenes NOT DETECTED NOT DETECTED   Klebsiella oxytoca NOT DETECTED NOT DETECTED   Klebsiella pneumoniae NOT DETECTED NOT DETECTED   Proteus species NOT DETECTED NOT DETECTED   Salmonella species NOT DETECTED NOT DETECTED   Serratia marcescens NOT DETECTED NOT DETECTED    Haemophilus influenzae NOT DETECTED NOT DETECTED   Neisseria meningitidis NOT DETECTED NOT DETECTED   Pseudomonas aeruginosa NOT DETECTED NOT DETECTED   Stenotrophomonas maltophilia NOT DETECTED NOT DETECTED   Candida albicans NOT DETECTED NOT DETECTED   Candida auris NOT DETECTED NOT DETECTED   Candida glabrata NOT DETECTED NOT DETECTED   Candida krusei NOT DETECTED NOT DETECTED   Candida parapsilosis NOT DETECTED NOT DETECTED   Candida tropicalis NOT DETECTED NOT DETECTED   Cryptococcus neoformans/gattii NOT DETECTED NOT DETECTED   CTX-M ESBL NOT DETECTED NOT DETECTED   Carbapenem resistance IMP NOT DETECTED NOT DETECTED   Carbapenem resistance KPC NOT DETECTED NOT DETECTED   Carbapenem resistance NDM NOT DETECTED NOT DETECTED   Carbapenem resist OXA 48 LIKE NOT DETECTED NOT DETECTED   Vancomycin resistance NOT DETECTED NOT DETECTED   Carbapenem resistance VIM NOT DETECTED NOT DETECTED    Clair Agent 01/08/2024  2:42 AM

## 2024-01-08 NOTE — Consult Note (Addendum)
 [facility]    Date of Admission:  01/07/2024     Total days of antibiotics 1                 Reason for Consult: E faecium / E coli bacteremia, 2/2 cholangitis     Referring Provider: Fleeta Rothman  Primary Care Provider: Clarice Nottingham, MD   Assessment: Molly Myers is a 73 y.o. female admitted from home with AMS, fevers and abdominal pain, found to have:   Polymicrobial Bacteremia -  Enterococcus Faecium, E Coli -  Encephalopathy (resolved) -  Bacteremia in the setting of cholangitis - she has MRI of abdomen that shows multiple stones filling the mid and distal common bile duct measuring up to 8 mm. She is due to undergo ERCP this afternoon with GI team to help with clearing duct.  With no vancomycin  resistance detected on BCID, will add vancomycin  to zosyn  given e faecium can have intrinsic ampicillin resistance. Continue zosyn  for now. Will make further recommendations once susceptibilities are back.  Will repeat her BCx tomorrow AM given enterococcus is in the picture. E faecium not typical for endocarditis.   Abnormal Chest XRAY -  Indicates RLL opacity - ?if related to atelectasis from pain vs aspiration PNA with h/o vomiting and AMS. No supplemental O2. Well covered if this is infectious.   Chronic back pain -  At first was saying this was severely increased, however felt as if it was having to stop maintenance opioids and acute flare from that. She feels like this is more at baseline today but in general uncomfortable under right upper abdomen. Will follow up tomorrow.   Med Rec -  Requested to have her PM progesterone  100 mg nightly tablet and armor thyroid  added back to medication list.   Reactive Hepatitis C Antibody -  Did not discuss this today but will ask her about any history of treatment for this in the past. RNA level to determine if treatment is needed, pending.    Plan: Add vancomycin  to cover E Faecium better  Continue  zosyn  for now  ECRP today - if stones can be cleared, usually 7d of abx are sufficient  Repeat BCx in AM with enterococcus in blood    Principal Problem:   Sepsis due to pneumonia (HCC)    morphine   30 mg Oral Q12H   nicotine   21 mg Transdermal Daily   sodium chloride  flush  3 mL Intravenous Q12H    HPI: Molly Myers is a 73 y.o. female admitted from home with AMS, fevers and abdominal pain.   RUQ pain that is migrating to anterior/posterior chest now.   She says she has had off and on nausea/vomiting for a few weeks now. Saturday PM she became very confused and does not recall much. Husband recorced a very high fever @ 105 F. No cough but extreme shortness of breaths with pain   H/O chronic back pain with pain management. She has bad back pain that is significantly worse than normal with changes with pain medications but attributes this to changing some of her medications while admitted - she does think that things are being adjusted   She had GB removed in the 80s. Did have a stone develop in the bile duct about a year or so later that required ERCP for stone extraction.   She takes progesterone  100 mg PM and Armor takes 75 mcg armor thyroid . She would  like to stop smoking and finds the nicotine patches are working well for her currently. 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    Review of Systems: Review of Systems  Constitutional:  Positive for chills, fever and malaise/fatigue.  Musculoskeletal:  Positive for back pain.    Past Medical History:  Diagnosis Date   Asthma    Hypothyroidism     Social History   Tobacco Use   Smoking status: Every Day    Current packs/day: 0.75    Average packs/day: 0.8 packs/day for 48.0 years (36.0 ttl pk-yrs)    Types: Cigarettes   Smokeless tobacco: Never   Tobacco comments:    typically smokes between 1/4 and 1/2 ppd (04/20/15)  Vaping Use   Vaping status: Former  Substance Use Topics   Alcohol use:  No    Alcohol/week: 0.0 standard drinks of alcohol   Drug use: No    Family History  Problem Relation Age of Onset   Asthma Brother        childhood   Asthma Father        adult onset   Hypertension Maternal Aunt    Congestive Heart Failure Maternal Grandfather    Hypertension Mother    Congestive Heart Failure Maternal Aunt    Allergies  Allergen Reactions   Iodinated Contrast Media Shortness Of Breath and Rash   Erythromycin Nausea And Vomiting    OBJECTIVE: Blood pressure (!) 124/55, pulse 90, temperature 98.2 F (36.8 C), temperature source Oral, resp. rate (!) 24, height 5' 3 (1.6 m), weight 58.1 kg, SpO2 98%.  Physical Exam  Lab Results Lab Results  Component Value Date   WBC 16.0 (H) 01/08/2024   HGB 13.6 01/08/2024   HCT 40.3 01/08/2024   MCV 86.1 01/08/2024   PLT 124 (L) 01/08/2024    Lab Results  Component Value Date   CREATININE 1.07 (H) 01/08/2024   BUN 20 01/08/2024   NA 138 01/08/2024   K 4.6 01/08/2024   CL 106 01/08/2024   CO2 23 01/08/2024    Lab Results  Component Value Date   ALT 53 (H) 01/08/2024   AST 77 (H) 01/08/2024   ALKPHOS 98 01/08/2024   BILITOT 6.8 (H) 01/08/2024     Microbiology: Recent Results (from the past 240 hours)  Resp panel by RT-PCR (RSV, Flu A&B, Covid) Anterior Nasal Swab     Status: None   Collection Time: 01/07/24  9:47 AM   Specimen: Anterior Nasal Swab  Result Value Ref Range Status   SARS Coronavirus 2 by RT PCR NEGATIVE NEGATIVE Final   Influenza A by PCR NEGATIVE NEGATIVE Final   Influenza B by PCR NEGATIVE NEGATIVE Final    Comment: (NOTE) The Xpert Xpress SARS-CoV-2/FLU/RSV plus assay is intended as an aid in the diagnosis of influenza from Nasopharyngeal swab specimens and should not be used as a sole basis for treatment. Nasal washings and aspirates are unacceptable for Xpert Xpress SARS-CoV-2/FLU/RSV testing.  Fact Sheet for Patients: bloggercourse.com  Fact Sheet for  Healthcare Providers: seriousbroker.it  This test is not yet approved or cleared by the [place]  FDA and has been authorized for detection and/or diagnosis of SARS-CoV-2 by FDA under an Emergency Use Authorization (EUA). This EUA will remain in effect (meaning this test can be used) for the duration of the COVID-19 declaration under Section 564(b)(1) of the Act, 21 U.S.C. section 360bbb-3(b)(1), unless the authorization is terminated or revoked.     Resp  Syncytial Virus by PCR NEGATIVE NEGATIVE Final    Comment: (NOTE) Fact Sheet for Patients: bloggercourse.com  Fact Sheet for Healthcare Providers: seriousbroker.it  This test is not yet approved or cleared by the [place]  FDA and has been authorized for detection and/or diagnosis of SARS-CoV-2 by FDA under an Emergency Use Authorization (EUA). This EUA will remain in effect (meaning this test can be used) for the duration of the COVID-19 declaration under Section 564(b)(1) of the Act, 21 U.S.C. section 360bbb-3(b)(1), unless the authorization is terminated or revoked.  Performed at [facility], [address] [place], [place] [address]   Blood Culture (routine x 2)     Status: None (Preliminary result)   Collection Time: 01/07/24  9:47 AM   Specimen: BLOOD LEFT ARM  Result Value Ref Range Status   Specimen Description BLOOD LEFT ARM  Final   Special Requests   Final    BOTTLES DRAWN AEROBIC AND ANAEROBIC Blood Culture adequate volume   Culture  Setup Time   Final    GRAM NEGATIVE RODS GRAM POSITIVE COCCI IN PAIRS IN CHAINS IN BOTH AEROBIC AND ANAEROBIC BOTTLES CRITICAL RESULT CALLED TO, READ BACK BY AND VERIFIED WITH: PHARMD J LEDFORD 01/08/2024 @ 0229 BY AB Performed at [facility], [address] [place], [place] [address]    Culture VONNE NEGATIVE RODS GRAM POSITIVE COCCI   Final   Report Status PENDING  Incomplete   Blood Culture ID Panel (Reflexed)     Status: Abnormal   Collection Time: 01/07/24  9:47 AM  Result Value Ref Range Status   Enterococcus faecalis NOT DETECTED NOT DETECTED Final   Enterococcus Faecium DETECTED (A) NOT DETECTED Final    Comment: CRITICAL RESULT CALLED TO, READ BACK BY AND VERIFIED WITH: PHARMD J LEDFORD 01/08/2024 @ 0229 BY AB    Listeria monocytogenes NOT DETECTED NOT DETECTED Final   Staphylococcus species NOT DETECTED NOT DETECTED Final   Staphylococcus aureus (BCID) NOT DETECTED NOT DETECTED Final   Staphylococcus epidermidis NOT DETECTED NOT DETECTED Final   Staphylococcus lugdunensis NOT DETECTED NOT DETECTED Final   Streptococcus species NOT DETECTED NOT DETECTED Final   Streptococcus agalactiae NOT DETECTED NOT DETECTED Final   Streptococcus pneumoniae NOT DETECTED NOT DETECTED Final   Streptococcus pyogenes NOT DETECTED NOT DETECTED Final   A.calcoaceticus-baumannii NOT DETECTED NOT DETECTED Final   Bacteroides fragilis NOT DETECTED NOT DETECTED Final   Enterobacterales DETECTED (A) NOT DETECTED Final    Comment: Enterobacterales represent a large order of gram negative bacteria, not a single organism. CRITICAL RESULT CALLED TO, READ BACK BY AND VERIFIED WITH: PHARMD J LEDFORD 01/08/2024 @ 0229 BY AB    Enterobacter cloacae complex NOT DETECTED NOT DETECTED Final   Escherichia coli DETECTED (A) NOT DETECTED Final    Comment: CRITICAL RESULT CALLED TO, READ BACK BY AND VERIFIED WITH: PHARMD J LEDFORD 01/08/2024 @ 0229 BY AB    Klebsiella aerogenes NOT DETECTED NOT DETECTED Final   Klebsiella oxytoca NOT DETECTED NOT DETECTED Final   Klebsiella pneumoniae NOT DETECTED NOT DETECTED Final   Proteus species NOT DETECTED NOT DETECTED Final   Salmonella species NOT DETECTED NOT DETECTED Final   Serratia marcescens NOT DETECTED NOT DETECTED Final   Haemophilus influenzae NOT DETECTED NOT DETECTED Final   Neisseria meningitidis NOT DETECTED NOT DETECTED Final    Pseudomonas aeruginosa NOT DETECTED NOT DETECTED Final   Stenotrophomonas maltophilia NOT DETECTED NOT DETECTED Final   Candida albicans NOT DETECTED NOT DETECTED  Final   Candida auris NOT DETECTED NOT DETECTED Final   Candida glabrata NOT DETECTED NOT DETECTED Final   Candida krusei NOT DETECTED NOT DETECTED Final   Candida parapsilosis NOT DETECTED NOT DETECTED Final   Candida tropicalis NOT DETECTED NOT DETECTED Final   Cryptococcus neoformans/gattii NOT DETECTED NOT DETECTED Final   CTX-M ESBL NOT DETECTED NOT DETECTED Final   Carbapenem resistance IMP NOT DETECTED NOT DETECTED Final   Carbapenem resistance KPC NOT DETECTED NOT DETECTED Final   Carbapenem resistance NDM NOT DETECTED NOT DETECTED Final   Carbapenem resist OXA 48 LIKE NOT DETECTED NOT DETECTED Final   Vancomycin  resistance NOT DETECTED NOT DETECTED Final   Carbapenem resistance VIM NOT DETECTED NOT DETECTED Final    Comment: Performed at [facility], [address], [place], [place] [address]  Blood Culture (routine x 2)     Status: None (Preliminary result)   Collection Time: 01/07/24  9:52 AM   Specimen: BLOOD RIGHT ARM  Result Value Ref Range Status   Specimen Description BLOOD RIGHT ARM  Final   Special Requests   Final    BOTTLES DRAWN AEROBIC AND ANAEROBIC Blood Culture adequate volume   Culture  Setup Time   Final    GRAM NEGATIVE RODS IN BOTH AEROBIC AND ANAEROBIC BOTTLES GRAM POSITIVE COCCI IN PAIRS IN CHAINS AEROBIC BOTTLE ONLY Performed at [facility], [address], [place], [place] [address]    Culture GRAM NEGATIVE RODS GRAM POSITIVE COCCI   Final   Report Status PENDING  Incomplete  Respiratory (~20 pathogens) panel by PCR     Status: None   Collection Time: 01/07/24  8:46 PM  Result Value Ref Range Status   Adenovirus NOT DETECTED NOT DETECTED Final   Coronavirus 229E NOT DETECTED NOT DETECTED Final    Comment: (NOTE) The Coronavirus on the Respiratory Panel, DOES NOT test for  the novel  Coronavirus (2019 nCoV)    Coronavirus HKU1 NOT DETECTED NOT DETECTED Final   Coronavirus NL63 NOT DETECTED NOT DETECTED Final   Coronavirus OC43 NOT DETECTED NOT DETECTED Final   Metapneumovirus NOT DETECTED NOT DETECTED Final   Rhinovirus / Enterovirus NOT DETECTED NOT DETECTED Final   Influenza A NOT DETECTED NOT DETECTED Final   Influenza B NOT DETECTED NOT DETECTED Final   Parainfluenza Virus 1 NOT DETECTED NOT DETECTED Final   Parainfluenza Virus 2 NOT DETECTED NOT DETECTED Final   Parainfluenza Virus 3 NOT DETECTED NOT DETECTED Final   Parainfluenza Virus 4 NOT DETECTED NOT DETECTED Final   Respiratory Syncytial Virus NOT DETECTED NOT DETECTED Final   Bordetella pertussis NOT DETECTED NOT DETECTED Final   Bordetella Parapertussis NOT DETECTED NOT DETECTED Final   Chlamydophila pneumoniae NOT DETECTED NOT DETECTED Final   Mycoplasma pneumoniae NOT DETECTED NOT DETECTED Final    Comment: Performed at [facility], [address], [place], [place] [address]     Corean Fireman, MSN, NP-C [facility]  [email or web address] Pager: [phone] Office: [phone] [facility] Main Line: [phone] *Secure Chat Communication Welcome

## 2024-01-08 NOTE — Op Note (Addendum)
 [facility] Patient Name: Molly Myers Procedure Date: 01/08/2024 MRN: [id number] Attending MD: Aloha Finner , MD, [id number] Date of Birth: [birth year] CSN: [id number] Age: 73 Admit Type: Inpatient Procedure:                ERCP Indications:              Bile duct stone(s) Providers:                Aloha Finner, MD, Hoy Penner, RN, Corene Southgate, Technician Referring MD:              Medicines:                General Anesthesia, Zosyn 3.375 g IV, Diclofenac                            100 mg rectal, Glucagon 0.25 mg IV Complications:            No immediate complications. Estimated Blood Loss:     Estimated blood loss was minimal. Procedure:                Pre-Anesthesia Assessment:                           - Prior to the procedure, a History and Physical                            was performed, and patient medications and                            allergies were reviewed. The patient's tolerance of                            previous anesthesia was also reviewed. The risks                            and benefits of the procedure and the sedation                            options and risks were discussed with the patient.                            All questions were answered, and informed consent                            was obtained. Prior Anticoagulants: The patient has                            taken heparin, last dose was 1 day prior to                            procedure. ASA Grade Assessment: III - A patient  with severe systemic disease. After reviewing the                            risks and benefits, the patient was deemed in                            satisfactory condition to undergo the procedure.                           After obtaining informed consent, the scope was                            passed under direct vision. Throughout the                            procedure, the  patient's blood pressure, pulse, and                            oxygen saturations were monitored continuously. The                            [employer] single use                            duodenoscope was introduced through the mouth, and                            used to inject contrast into and used to locate the                            major papilla. The ERCP was accomplished without                            difficulty. The patient tolerated the procedure. Scope In: Scope Out: Findings:      A scout film of the abdomen was obtained. Surgical clips, consistent       with a previous cholecystectomy, were seen in the area of the right       upper quadrant of the abdomen.      The upper GI tract was traversed under direct vision without detailed       examination. No gross lesions were noted in the distal esophagus. Patchy       moderate inflammation characterized by erosions, erythema and       granularity was found in the entire examined stomach - biopsied for H.       pylori assessment. No gross lesions were noted in the duodenal bulb, in       the first portion of the duodenum and in the second portion of the       duodenum. The major papilla was on the rim of a diverticulum. The major       papilla was small. Pus was emerging from the major papilla.      A 0.035 inch x 260 cm straight Hydra Jagwire was passed into the biliary       tree. The Hydratome sphincterotome was passed over the  guidewire and the       bile duct was then deeply cannulated. Contrast was injected. I       personally interpreted the bile duct images. Ductal flow of contrast was       adequate. Image quality was adequate. Contrast extended to the hepatic       ducts. Opacification of the entire biliary tree except for the cystic       duct and gallbladder was successful. The main bile duct was moderately       dilated. The largest diameter was 13 mm. The main bile duct contained        filling defects thought to be stones and sludge. Due to the size and how       close on a rim this major papilla was, I could not make an extended       sphincterotomy. I proceeded with a 6 mm biliary sphincterotomy was made       with a monofilament Hydratome sphincterotome using ERBE electrocautery.       There was no post-sphincterotomy bleeding. This was then followed by       dilation of the distal common bile duct with an 09-15-08 mm balloon (to a       maximum balloon size of 10 mm) dilator was successful as a       sphincteroplasty for 2 minutes. To discover objects, the biliary tree       was swept with a retrieval balloon. Sludge was swept from the duct. 14       stones were removed. No further stones were felt to have remained. Pus       was swept from the duct and this did not stop even after completion of       the stone extraction. Flow of contrast was slow. Because of her       underlying cholangitis, and the slow flow, I decided to place a stent to       ensure patency. One 10 Fr by 5 cm plastic biliary stent with a single       external flap and a single internal flap was placed into the common bile       duct. The stent was in good position.      A pancreatogram was not performed.      The duodenoscope was withdrawn from the patient. Impression:               - No gross lesions in the distal esophagus.                           - Erosive gastritis. Biopsied.                           - No gross lesions in the duodenal bulb, in the                            first portion of the duodenum and in the second                            portion of the duodenum.                           - The major papilla was on  the rim of a                            diverticulum. The major papilla appeared to be                            small. Pus was seen in the major papilla.                           - Filling defects consistent with stones and sludge                            was seen  on the cholangiogram.                           - The entire main bile duct was moderately dilated.                           - Choledocholithiasis was found. Complete removal                            was accomplished by biliary sphincterotomy and                            ballon sphincteroplasty.                           - Cholangitis persisted with decreased flow of bile                            so one plastic biliary stent was placed into the                            common bile duct to ensure patency. Moderate Sedation:      Not Applicable - Patient had care per Anesthesia. Recommendation:           - The patient will be observed post-procedure,                            until all discharge criteria are met.                           - Return patient to hospital ward for ongoing care.                           - Advance diet as tolerated.                           - Observe patient's clinical course.                           - Start Protonix 40 mg daily.                           - Await path results.                           -  Repeat ERCP in 2 months to remove stent.                           - Watch for pancreatitis, bleeding, perforation,                            and cholangitis.                           - Trend LFTs while in house.                           - The findings and recommendations were discussed                            with the patient.                           - The findings and recommendations were discussed                            with the referring physician. Procedure Code(s):        --- Professional ---                           [phone], Esophagogastroduodenoscopy, flexible,                            transoral; with biopsy, single or multiple Diagnosis Code(s):        --- Professional ---                           K29.70, Gastritis, unspecified, without bleeding                           K83.8, Other specified diseases of biliary tract                            K80.50, Calculus of bile duct without cholangitis                            or cholecystitis without obstruction                           R93.2, Abnormal findings on diagnostic imaging of                            liver and biliary tract CPT copyright 2022 [employer]. All rights reserved. The codes documented in this report are preliminary and upon coder review may  be revised to meet current compliance requirements. Aloha Finner, MD 01/08/2024 3:37:45 PM Number of Addenda: 0

## 2024-01-08 NOTE — Anesthesia Postprocedure Evaluation (Signed)
 Anesthesia Post Note  Patient: Molly Myers  Procedure(s) Performed: ERCP, WITH INTERVENTION IF INDICATED     Patient location during evaluation: PACU Anesthesia Type: General Level of consciousness: awake and alert and oriented Pain management: pain level controlled Vital Signs Assessment: post-procedure vital signs reviewed and stable Respiratory status: spontaneous breathing, nonlabored ventilation and respiratory function stable Cardiovascular status: blood pressure returned to baseline and stable Postop Assessment: no apparent nausea or vomiting Anesthetic complications: no   No notable events documented.  Last Vitals:  Vitals:   01/08/24 1600 01/08/24 1605  BP: (!) 135/46   Pulse: 86 84  Resp: 18 17  Temp:    SpO2: 91% 92%    Last Pain:  Vitals:   01/08/24 1540  TempSrc: Temporal  PainSc:                  Adrin Julian A.

## 2024-01-08 NOTE — Progress Notes (Incomplete)
 Med history

## 2024-01-08 NOTE — Transfer of Care (Signed)
 Immediate Anesthesia Transfer of Care Note  Patient: Molly Myers  Procedure(s) Performed: ERCP, WITH INTERVENTION IF INDICATED  Patient Location: Endoscopy Unit  Anesthesia Type:General  Level of Consciousness: awake and patient cooperative  Airway & Oxygen Therapy: Patient Spontanous Breathing and Patient connected to face mask  Post-op Assessment: Report given to RN and Post -op Vital signs reviewed and stable  Post vital signs: Reviewed and stable  Last Vitals:  Vitals Value Taken Time  BP 140/65 01/08/24 15:38  Temp    Pulse 101 01/08/24 15:39  Resp 19 01/08/24 15:39  SpO2 96 % 01/08/24 15:39  Vitals shown include unfiled device data.  Last Pain:  Vitals:   01/08/24 1250  TempSrc: Temporal  PainSc: 6          Complications: No notable events documented.

## 2024-01-08 NOTE — Telephone Encounter (Signed)
-----   Message from [facility] sent at 01/08/2024  3:45 PM EST ----- Regarding: Followup Molly Myers, This patient needs ERCP stent removal/cleanout for choledocholithiasis in 2-3 months. Thanks. GM

## 2024-01-08 NOTE — Consult Note (Addendum)
 Consultation  Referring Provider: [facility]/ Perri Primary Care Physician:  Clarice Nottingham, MD Primary Gastroenterologist:  unassigned  Reason for Consultation: Sepsis, multiple common bile duct stones, gram-negative bacteremia, cholangitis, probable pneumonia  HPI: Molly Myers is a 73 y.o. female who was admitted through the emergency room yesterday after becoming ill on Saturday evening.  Her significant other says that she was curled up, complaining of nausea, had a couple episodes of vomiting and then had complained of a cold shaking chill.  Yesterday morning she was very difficult to arouse, EMS was called and she was brought to the emergency room.  Initial temp 99 for then up to 101.  Vital signs otherwise stable, O2 sats in the 90s initially on room air. CT of the head was negative Chest x-ray showed possible right-sided pneumonia. Initial labs with WBC 13.9/hemoglobin 15.6/hematocrit 46.0/platelets 184 Sodium 138/potassium 3.1/BUN 12/creatinine 0.97 T. bili 7.5/alk phos 137/AST 135/ALT 77  Blood cultures were done and are growing gram-negative rods in both aerobic and anaerobic bottles and gram-positive cocci in aerobic bottle  Broad-spectrum antibiotics initiated with Zosyn and vancomycin.  Abdominal ultrasound shows patient to be status postcholecystectomy with CBD of 13.8 mm, associated intrahepatic ductal dilation and no evidence of hepatic steatosis.  MRI/MRCP shows common bile duct 1.6 cm, multiple stones in mid and distal duct, there is also possible edema of the head and uncinate process of the pancreas.  Patient mentating well this morning says she is feeling better, does not really remember yesterday morning.  She is complaining of right sided abdominal pain, and back pain, also feels a bit short of breath.  She relates that she is on chronic high-dose narcotics through pain management for chronic back pain and has not been receiving her usual regimen of pain medications  which is MS Contin 30 mg twice daily and oxycodone 10 mg 4 times daily.  Patient is status post cholecystectomy in the 1980s, relates that about a year later she had an episode of choledocholithiasis and did require ERCP with stone extraction.  She says she had not had any problems since then.  However her boyfriend who is at bedside says that she had an episode in August with nausea, then altered mental status for which she was evaluated in the emergency room.  She had an elevated D-dimer, and review of the labs shows that her T. bili was 4.8 at that time with AST of 82/alk phos 128.  She did not have any abdominal imaging with that ER evaluation and ultimately was allowed discharge home.  Blood cultures positive for Enterococcus, and E. Coli  Labs today WBC 16.0/hemoglobin 13.6/hematocrit 40.3 Potassium 4.6 T. bili 6.8/alk phos 98/AST 77/ALT 53  O2 sat 98 on room air  Past Medical History:  Diagnosis Date   Asthma    Hypothyroidism     Past Surgical History:  Procedure Laterality Date   BREAST REDUCTION SURGERY Bilateral benign tumors   CHOLECYSTECTOMY  in the 80s    Prior to Admission medications   Medication Sig Start Date End Date Taking? Authorizing Provider  morphine (MS CONTIN) 30 MG 12 hr tablet Take 30 mg by mouth every 12 (twelve) hours.   Yes [provider]  Oxycodone HCl 10 MG TABS Take 10 mg by mouth 4 (four) times daily as needed. For back pain 04/06/15  Yes [provider]  ADVAIR DISKUS 250-50 MCG/DOSE AEPB INHALE 1 PUFF INTO THE LUNGS EVERY 12 HOURS NEED APPOINTMENT FOR FURTHER REFILLS 10/17/16  Wert, Michael B, MD  albuterol (PROVENTIL HFA;VENTOLIN HFA) 108 (90 Base) MCG/ACT inhaler Inhale 1-2 puffs into the lungs every 6 (six) hours as needed for wheezing or shortness of breath.    [provider]  albuterol (PROVENTIL) (2.5 MG/3ML) 0.083% nebulizer solution 1 vial in nebulizer every 6 hours as needed 04/10/15   [provider]   DHEA 10 MG TABS Take 10 mg by mouth daily.    [provider]  metaxalone (SKELAXIN) 800 MG tablet Take 800 mg by mouth every 8 (eight) hours as needed. 06/29/15   [provider]  potassium chloride  SA (KLOR-CON  M) 20 MEQ tablet Take 1 tablet (20 mEq total) by mouth 2 (two) times daily. 09/17/23   Raford Lenis, MD  UNABLE TO FIND Med Name: hormone cream    [provider]  XTAMPZA ER 27 MG C12A TAKE ONE CAPSULE EVERY 12 HOURS 07/03/16   [provider]    Current Facility-Administered Medications  Medication Dose Route Frequency Provider Last Rate Last Admin   0.9 % NaCl with KCl 20 mEq/ L  infusion   Intravenous Continuous Alto Isaiah CROME, NP 125 mL/hr at 01/07/24 1845 Infusion Verify at 01/07/24 1845   acetaminophen (TYLENOL) tablet 650 mg  650 mg Oral Q6H PRN Alto Isaiah CROME, NP       Or   acetaminophen (TYLENOL) suppository 650 mg  650 mg Rectal Q6H PRN Alto Isaiah CROME, NP       enoxaparin (LOVENOX) injection 40 mg  40 mg Subcutaneous Q24H Alto Isaiah CROME, NP   40 mg at 01/07/24 1216   ipratropium-albuterol (DUONEB) 0.5-2.5 (3) MG/3ML nebulizer solution 3 mL  3 mL Nebulization Q6H PRN Alto Isaiah CROME, NP       morphine (MS CONTIN) 12 hr tablet 30 mg  30 mg Oral Q12H Alto Isaiah CROME, NP   30 mg at 01/07/24 2251   nicotine (NICODERM CQ - dosed in mg/24 hours) patch 21 mg  21 mg Transdermal Daily Alto Isaiah CROME, NP   21 mg at 01/07/24 1439   ondansetron (ZOFRAN) tablet 4 mg  4 mg Oral Q6H PRN Alto Isaiah CROME, NP       Or   ondansetron (ZOFRAN) injection 4 mg  4 mg Intravenous Q6H PRN Alto Isaiah CROME, NP       Oral care mouth rinse  15 mL Mouth Rinse PRN Georgina Basket, MD       oxyCODONE (Oxy IR/ROXICODONE) immediate release tablet 5 mg  5 mg Oral Q6H PRN Alto Isaiah CROME, NP   5 mg at 01/07/24 2251   piperacillin-tazobactam (ZOSYN) IVPB 3.375 g  3.375 g Intravenous Q8H Fobbs, Rodericks T, RPH 12.5 mL/hr at 01/08/24 0607 3.375 g at 01/08/24 9392    sodium chloride flush (NS) 0.9 % injection 3 mL  3 mL Intravenous Q12H Alto Isaiah CROME, NP   3 mL at 01/07/24 2251   vancomycin (VANCOREADY) IVPB 750 mg/150 mL  750 mg Intravenous Q24H Clair Lynwood CROME, RPH        Allergies as of 01/07/2024 - Review Complete 01/07/2024  Allergen Reaction Noted   Iodinated contrast media Shortness Of Breath and Rash 09/16/2023   Erythromycin Nausea And Vomiting 04/20/2015    Family History  Problem Relation Age of Onset   Asthma Brother        childhood   Asthma Father        adult onset   Hypertension Maternal Aunt    Congestive Heart  Failure Maternal Grandfather    Hypertension Mother    Congestive Heart Failure Maternal Aunt     Social History   Socioeconomic History   Marital status: Single    Spouse name: Not on file   Number of children: Not on file   Years of education: Not on file   Highest education level: Not on file  Occupational History   Not on file  Tobacco Use   Smoking status: Every Day    Current packs/day: 0.75    Average packs/day: 0.8 packs/day for 48.0 years (36.0 ttl pk-yrs)    Types: Cigarettes   Smokeless tobacco: Never   Tobacco comments:    typically smokes between 1/4 and 1/2 ppd (04/20/15)  Vaping Use   Vaping status: Former  Substance and Sexual Activity   Alcohol use: No    Alcohol/week: 0.0 standard drinks of alcohol   Drug use: No   Sexual activity: Not on file  Other Topics Concern   Not on file  Social History Narrative   Lives with fiance and mother (dementia, pt is the sole caregiver)   [employer]DR[employer] - 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 [employer]   No recent travel   No children   Social Drivers of Corporate Investment Banker Strain: Not on file  Food Insecurity: No Food Insecurity (01/07/2024)   Hunger Vital Sign    Worried About Running Out of Food in the Last Year: Never true    Ran Out of Food in the Last Year: Never true  Transportation Needs: No Transportation Needs (01/07/2024)    PRAPARE - Administrator, Civil Service (Medical): No    Lack of Transportation (Non-Medical): No  Physical Activity: Not on file  Stress: Not on file  Social Connections: Moderately Isolated (01/07/2024)   Social Connection and Isolation Panel    Frequency of Communication with Friends and Family: More than three times a week    Frequency of Social Gatherings with Friends and Family: Never    Attends Religious Services: Never    Database Administrator or Organizations: No    Attends Banker Meetings: Never    Marital Status: Married  Catering Manager Violence: Not At Risk (01/07/2024)   Humiliation, Afraid, Rape, and Kick questionnaire    Fear of Current or Ex-Partner: No    Emotionally Abused: No    Physically Abused: No    Sexually Abused: No    Review of Systems: Pertinent positive and negative review of systems were noted in the above HPI section.  All other review of systems was otherwise negative.   Physical Exam: Vital signs in last 24 hours: Temp:  [97.7 F (36.5 C)-101.5 F (38.6 C)] 98.2 F (36.8 C) (12/01 0805) Pulse Rate:  [84-117] 90 (12/01 0805) Resp:  [16-24] 24 (12/01 0805) BP: (107-131)/(54-65) 124/55 (12/01 0805) SpO2:  [94 %-98 %] 98 % (12/01 0805) Weight:  [58.1 kg] 58.1 kg (11/30 0936) Last BM Date : 01/06/24 General:   Alert,  Well-developed, well-nourished, older white female pleasant and cooperative in NAD, significant other at bedside-patient is jaundiced Head:  Normocephalic and atraumatic. Eyes:  Sclera icteric   conjunctiva pink. Ears:  Normal auditory acuity. Nose:  No deformity, discharge,  or lesions. Mouth:  No deformity or lesions.   Neck:  Supple; no masses or thyromegaly. Lungs: No increased work of breathing, few scattered rhonchi right greater than left  heart:  Regular rate and rhythm; no murmurs, clicks,  rubs,  or gallops. Abdomen:  Soft, she is tender in the epigastrium and right upper quadrant, some  guarding no rebound BS active,nonpalp mass or hsm.  Low midline transverse incisional scar Rectal: Not done Msk:  Symmetrical without gross deformities. . Pulses:  Normal pulses noted. Extremities:  Without clubbing or edema. Neurologic:  Alert and  oriented x4;  grossly normal neurologically. Skin:  Intact without significant lesions or rashes.. Psych:  Alert and cooperative. Normal mood and affect.  Intake/Output from previous day: 11/30 0701 - 12/01 0700 In: 1773.2 [I.V.:806.2; IV Piggyback:967] Out: -  Intake/Output this shift: Total I/O In: -  Out: 400 [Urine:400]  Lab Results: Recent Labs    01/07/24 1020 01/07/24 1030 01/07/24 1031 01/08/24 0632  WBC 13.9*  --   --  16.0*  HGB 15.6* 15.6* 16.0* 13.6  HCT 46.0 46.0 47.0* 40.3  PLT 184  --   --  124*   BMET Recent Labs    01/07/24 1020 01/07/24 1030 01/07/24 1031 01/08/24 0632  NA 138 138 136 138  K 3.1* 3.1* 3.1* 4.6  CL 101 98  --  106  CO2 22  --   --  23  GLUCOSE 90 96  --  75  BUN 12 13  --  20  CREATININE 0.97 1.00  --  1.07*  CALCIUM 9.4  --   --  7.8*   LFT Recent Labs    01/08/24 0632  PROT 5.3*  ALBUMIN 2.6*  AST 77*  ALT 53*  ALKPHOS 98  BILITOT 6.8*   PT/INR No results for input(s): LABPROT, INR in the last 72 hours. Hepatitis Panel Recent Labs    01/07/24 1800  HEPBSAG NON REACTIVE  HCVAB Reactive*  HEPAIGM NON REACTIVE  HEPBIGM NON REACTIVE      IMPRESSION:  #58 73 year old white female status post remote cholecystectomy 1980s, with previous history of choledocholithiasis requiring ERCP and stone extraction about 1 year postcholecystectomy.  Patient presents now with onset of illness about 36 hours ago with nausea, shaking chill, then several hours later found to have altered mental status.  Brought to the emergency room and met sepsis criteria  Chest x-ray shows probable right lower lobe pneumonia Noted to have elevated LFTs with T. bili of 7.5 on presentation,  leukocytosis and fever of 101 Subsequent workup with abdominal ultrasound then MRI/MRCP confirms a dilated common bile duct with multiple stones in the mid and distal CBD, possible mild inflammatory changes of the uncinated and head of the pancreas.  Blood cultures are positive for Enterococcus and E. Coli She is being covered with IV vancomycin and Zosyn  Looks much better today, hemodynamically stable and O2 sats high 90s on room air  Picture most consistent with sepsis/multiorgan bacteremia secondary to cholangitis from choledocholithiasis.  #2 probable right lower lobe pneumonia question aspiration related to nausea and vomiting #3 history of chronic back pain, followed by pain management on high-dose narcotics chronically #5 asthma 6.  Hypothyroidism  Plan; keep n.p.o. Continue IV Zosyn/vancomycin Stop Lovenox-I canceled for today Patient will be scheduled for ERCP with stone extraction with Dr. Wilhelmenia at [facility] this afternoon.  Patient will have day trip to [facility] for procedure, then return here.  I have discussed the ERCP in detail at bedside with the patient and her boyfriend this morning including all indications risks and benefits and she is agreeable to proceed. Repeat labs in a.m. GI will continue to follow with you    Amy EsterwoodPA-C  01/08/2024, 8:35 AM       Attending Physician's Attestation   I have taken an interval history, reviewed the chart and examined the patient.   This is a patient that the GI service is asked to evaluate in the setting of choledocholithiasis and septic physiology concerning for underlying cholangitis.  Patient came into the hospital last night after feeling unwell with significant nausea and episodes of vomiting and abdominal discomfort.  She states about a month ago she had a single episode of darkened urine but this improved.  Now she has felt worse and though she did not notice jaundice she has noted the change in the color  of her urine.  She underwent cross-sectional imaging as well as laboratories that showed elevation in her liver biochemical testing with subsequent ultrasound showing a dilated CBD.  MRCP was performed showing choledocholithiasis as well as possible edema of the pancreas itself.  I have personally reviewed her MRI/MRCP showing evidence of multiple choledocholithiasis.  Her laboratories show evidence of progressive leukocytosis while on antibiotic therapy, and show a drop in her hemoglobin showing that she is not actually hemoconcentrated.  Her LFTs continue to remain elevated.  She has GNR bacteremia present currently.  She has significant RUQ pain and this extends into her right chest wall and back as well with a positive Carnett sign.  Most concerning is cholangitis as her clinical picture.  Next step in her evaluation and treatment is need for ERCP.  The risks of an ERCP were discussed at length, including but not limited to the risk of perforation, bleeding, abdominal pain, post-ERCP pancreatitis (while usually mild can be severe and even life threatening).  Pending all goes well with today's planned procedure, she will return to [facility] later this afternoon/evening.  All patient questions were answered to the best of my ability, and the patient agrees to the aforementioned plan of action with follow-up as indicated.   I agree with the Advanced Practitioner's note, impression, and recommendations with updates and my documentation as noted above.  The majority of the medical decision making/process, formulation of the impression/plan of action for the patient were performed by me with substantive portion of this encounter (>50% time spent including complete performance of at least one of the key components of MDM, History, and/or Exam).   Aloha Finner, MD [facility] Office # [phone]

## 2024-01-08 NOTE — Telephone Encounter (Signed)
 ERCP recall entered

## 2024-01-09 ENCOUNTER — Encounter (HOSPITAL_COMMUNITY): Payer: Self-pay | Admitting: Gastroenterology

## 2024-01-09 ENCOUNTER — Inpatient Hospital Stay (HOSPITAL_COMMUNITY)

## 2024-01-09 DIAGNOSIS — K8050 Calculus of bile duct without cholangitis or cholecystitis without obstruction: Secondary | ICD-10-CM | POA: Diagnosis not present

## 2024-01-09 DIAGNOSIS — R7881 Bacteremia: Secondary | ICD-10-CM

## 2024-01-09 DIAGNOSIS — A419 Sepsis, unspecified organism: Secondary | ICD-10-CM | POA: Diagnosis not present

## 2024-01-09 DIAGNOSIS — J189 Pneumonia, unspecified organism: Secondary | ICD-10-CM | POA: Diagnosis not present

## 2024-01-09 DIAGNOSIS — A499 Bacterial infection, unspecified: Secondary | ICD-10-CM | POA: Diagnosis not present

## 2024-01-09 LAB — PHOSPHORUS: Phosphorus: 2.3 mg/dL — ABNORMAL LOW (ref 2.5–4.6)

## 2024-01-09 LAB — CBC WITH DIFFERENTIAL/PLATELET
Abs Immature Granulocytes: 0.36 K/uL — ABNORMAL HIGH (ref 0.00–0.07)
Basophils Absolute: 0 K/uL (ref 0.0–0.1)
Basophils Relative: 0 %
Eosinophils Absolute: 0 K/uL (ref 0.0–0.5)
Eosinophils Relative: 0 %
HCT: 38.2 % (ref 36.0–46.0)
Hemoglobin: 13 g/dL (ref 12.0–15.0)
Immature Granulocytes: 4 %
Lymphocytes Relative: 11 %
Lymphs Abs: 1.1 K/uL (ref 0.7–4.0)
MCH: 28.7 pg (ref 26.0–34.0)
MCHC: 34 g/dL (ref 30.0–36.0)
MCV: 84.3 fL (ref 80.0–100.0)
Monocytes Absolute: 0.4 K/uL (ref 0.1–1.0)
Monocytes Relative: 4 %
Neutro Abs: 8 K/uL — ABNORMAL HIGH (ref 1.7–7.7)
Neutrophils Relative %: 81 %
Platelets: 117 K/uL — ABNORMAL LOW (ref 150–400)
RBC: 4.53 MIL/uL (ref 3.87–5.11)
RDW: 14.1 % (ref 11.5–15.5)
WBC: 9.8 K/uL (ref 4.0–10.5)
nRBC: 0 % (ref 0.0–0.2)

## 2024-01-09 LAB — COMPREHENSIVE METABOLIC PANEL WITH GFR
ALT: 39 U/L (ref 0–44)
AST: 39 U/L (ref 15–41)
Albumin: 2.6 g/dL — ABNORMAL LOW (ref 3.5–5.0)
Alkaline Phosphatase: 110 U/L (ref 38–126)
Anion gap: 7 (ref 5–15)
BUN: 21 mg/dL (ref 8–23)
CO2: 21 mmol/L — ABNORMAL LOW (ref 22–32)
Calcium: 7.8 mg/dL — ABNORMAL LOW (ref 8.9–10.3)
Chloride: 108 mmol/L (ref 98–111)
Creatinine, Ser: 0.92 mg/dL (ref 0.44–1.00)
GFR, Estimated: >60 mL/min (ref >60)
Glucose, Bld: 146 mg/dL — ABNORMAL HIGH (ref 70–99)
Potassium: 4.4 mmol/L (ref 3.5–5.1)
Sodium: 136 mmol/L (ref 135–145)
Total Bilirubin: 3.1 mg/dL — ABNORMAL HIGH (ref 0.0–1.2)
Total Protein: 5.7 g/dL — ABNORMAL LOW (ref 6.5–8.1)

## 2024-01-09 LAB — ECHOCARDIOGRAM COMPLETE
AR max vel: 1.58 cm2
AV Area VTI: 1.73 cm2
AV Area mean vel: 1.62 cm2
AV Mean grad: 3 mmHg
AV Peak grad: 6.3 mmHg
Ao pk vel: 1.25 m/s
Area-P 1/2: 4.63 cm2
Calc EF: 50.8 %
Height: 63 in
MV VTI: 2.01 cm2
S' Lateral: 3 cm
Single Plane A2C EF: 51.4 %
Single Plane A4C EF: 50.8 %
Weight: 2049.4 [oz_av]

## 2024-01-09 LAB — MAGNESIUM: Magnesium: 2.1 mg/dL (ref 1.7–2.4)

## 2024-01-09 MED ORDER — THYROID 60 MG PO TABS
75.0000 mg | ORAL_TABLET | Freq: Every day | ORAL | Status: DC
  Administered 2024-01-10 – 2024-01-11 (×2): 75 mg via ORAL
  Filled 2024-01-09 (×2): qty 1

## 2024-01-09 MED ORDER — SENNOSIDES-DOCUSATE SODIUM 8.6-50 MG PO TABS
2.0000 | ORAL_TABLET | Freq: Every day | ORAL | Status: DC
  Administered 2024-01-09 – 2024-01-10 (×2): 2 via ORAL
  Filled 2024-01-09 (×2): qty 2

## 2024-01-09 MED ORDER — GLYCERIN (LAXATIVE) 2 G RE SUPP
1.0000 | Freq: Every day | RECTAL | Status: DC | PRN
  Administered 2024-01-09: 1 via RECTAL
  Filled 2024-01-09 (×2): qty 1

## 2024-01-09 MED ORDER — POLYETHYLENE GLYCOL 3350 17 G PO PACK
17.0000 g | PACK | Freq: Every day | ORAL | Status: DC
  Administered 2024-01-09 – 2024-01-11 (×2): 17 g via ORAL
  Filled 2024-01-09 (×2): qty 1

## 2024-01-09 MED ORDER — PROGESTERONE MICRONIZED 100 MG PO CAPS
100.0000 mg | ORAL_CAPSULE | Freq: Every day | ORAL | Status: DC
  Administered 2024-01-09 – 2024-01-10 (×2): 100 mg via ORAL
  Filled 2024-01-09 (×2): qty 1

## 2024-01-09 NOTE — Progress Notes (Addendum)
 Patient ID: Molly Gilcrease, female   DOB: 20-Jan-1951, 73 y.o.   MRN: [id number]    Progress Note   Subjective   Day # 3 CC; polymicrobial sepsis, cholangitis secondary to multiple common bile duct stones probable pneumonia  IV Zosyn/vancomycin Status post ERCP yesterday with sphincterotomy, and extraction of multiple common bile duct stones (14), there was active pus draining from the duct and this did not stop even after stone extraction so 10 French/5 cm plastic biliary stent was placed.  Patient feeling much better in general today says she is starting to feel like herself again, she is not having any significant abdominal pain still tender in the epigastrium and right upper quadrant but improved over yesterday.  She has been able to eat solid food Afebrile overnight  Labs today WBC 9.8/hemoglobin 13 hematocrit 38.2/platelets 117 Potassium 4.4/BUN 21/creatinine 0.92 T. bili 3.1 remainder of LFTs normal  ID to see today for guidance regarding antibiotics Patient also is having bedside echo currently     Objective   Vital signs in last 24 hours: Temp:  [97.5 F (36.4 C)-99.2 F (37.3 C)] 97.8 F (36.6 C) (12/02 0922) Pulse Rate:  [69-101] 69 (12/02 0922) Resp:  [15-20] 16 (12/02 0922) BP: (110-149)/(46-81) 128/71 (12/02 0922) SpO2:  [91 %-96 %] 94 % (12/01 1734) Weight:  [58.1 kg] 58.1 kg (12/01 1250) Last BM Date : 01/06/24 General:    Older white female in NAD looking and feeling better, less jaundiced Tmax 99 2 Heart:  Regular rate and rhythm; no murmurs Lungs: Respirations even and unlabored, somewhat decreased breath sounds bases Abdomen:  Soft, mildly tender in the epigastrium right upper quadrant no guarding or rebound normal bowel sounds. Extremities:  Without edema. Neurologic:  Alert and oriented,  grossly normal neurologically. Psych:  Cooperative. Normal mood and affect.  Intake/Output from previous day: 12/01 0701 - 12/02 0700 In: 298.4 [IV  Piggyback:298.4] Out: 400 [Urine:400] Intake/Output this shift: Total I/O In: 50 [IV Piggyback:50] Out: -   Lab Results: Recent Labs    01/07/24 1020 01/07/24 1030 01/07/24 1031 01/08/24 0632 01/09/24 0437  WBC 13.9*  --   --  16.0* 9.8  HGB 15.6*   < > 16.0* 13.6 13.0  HCT 46.0   < > 47.0* 40.3 38.2  PLT 184  --   --  124* 117*   < > = values in this interval not displayed.   BMET Recent Labs    01/07/24 1020 01/07/24 1030 01/07/24 1031 01/08/24 0632 01/09/24 0437  NA 138 138 136 138 136  K 3.1* 3.1* 3.1* 4.6 4.4  CL 101 98  --  106 108  CO2 22  --   --  23 21*  GLUCOSE 90 96  --  75 146*  BUN 12 13  --  20 21  CREATININE 0.97 1.00  --  1.07* 0.92  CALCIUM 9.4  --   --  7.8* 7.8*   LFT Recent Labs    01/09/24 0437  PROT 5.7*  ALBUMIN 2.6*  AST 39  ALT 39  ALKPHOS 110  BILITOT 3.1*   PT/INR No results for input(s): LABPROT, INR in the last 72 hours.  Studies/Results: MR ABDOMEN MRCP W WO CONTAST Result Date: 01/08/2024 EXAM: MRCP WITHOUT AND WITH IV CONTRAST 01/07/2024 10:41:25 PM TECHNIQUE: Multisequence, multiplanar magnetic resonance images of the abdomen without and with intravenous contrast. MRCP sequences were performed. CONTRAST: 6 mL of GADAVIST. COMPARISON: US  Abdomen limited 01/07/2024 3:59:47 PM. CLINICAL HISTORY: Biliary obstruction  suspected (Ped 0-17y). FINDINGS: LIVER: No suspicious focal liver lesion identified. Trace fluid along the inferior margin of right lobe of liver. GALLBLADDER AND BILIARY SYSTEM: Status post cholecystectomy. The common bile duct is dilated measuring up to 1.6 cm. There are numerous stones filling the mid and distal common bile duct which measure up to 8 mm. Moderate intrahepatic bile duct dilatation. SPLEEN: The spleen is within normal limits in size and appearance. PANCREAS/PANCREATIC DUCT: Possible edema involving the head and uncinate process of the pancreas identified with a small volume of free fluid along the  pancreaticoduodenal groove. No pancreatic ductal dilatation. ADRENAL GLANDS: Normal size and morphology bilaterally. No nodule, thickening, or hemorrhage. No periadrenal stranding. KIDNEYS: Bilateral perinephric soft tissue stranding, nonspecific. LYMPH NODES: Upper limits of normal lymph nodes within the upper abdomen, likely reactive. VASCULATURE: Unremarkable. PERITONEUM: No ascites. ABDOMINAL WALL: No hernia. No mass. BOWEL: Grossly unremarkable. No bowel obstruction. BONES: Lumbar scoliosis with convexity towards the left. No acute abnormality or worrisome osseous lesion. SOFT TISSUES: Unremarkable. MISCELLANEOUS: Subsegmental atelectasis identified within the lung bases. IMPRESSION: 1. Status post cholecystectomy. Choledocholithiasis with dilated common bile duct measuring up to 1.6 cm and stones up to 8 mm in the mid to distal duct. 2. 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 Electronically signed by: Waddell Calk MD 01/08/2024 05:19 AM EST RP Workstation: [id number]   MR 3D Recon At Scanner Result Date: 01/08/2024 EXAM: MRCP WITHOUT AND WITH IV CONTRAST 01/07/2024 10:41:25 PM TECHNIQUE: Multisequence, multiplanar magnetic resonance images of the abdomen without and with intravenous contrast. MRCP sequences were performed. CONTRAST: 6 mL of GADAVIST. COMPARISON: US  Abdomen limited 01/07/2024 3:59:47 PM. CLINICAL HISTORY: Biliary obstruction suspected (Ped 0-17y). FINDINGS: LIVER: No suspicious focal liver lesion identified. Trace fluid along the inferior margin of right lobe of liver. GALLBLADDER AND BILIARY SYSTEM: Status post cholecystectomy. The common bile duct is dilated measuring up to 1.6 cm. There are numerous stones filling the mid and distal common bile duct which measure up to 8 mm. Moderate intrahepatic bile duct dilatation. SPLEEN: The spleen is within normal limits in size and appearance.  PANCREAS/PANCREATIC DUCT: Possible edema involving the head and uncinate process of the pancreas identified with a small volume of free fluid along the pancreaticoduodenal groove. No pancreatic ductal dilatation. ADRENAL GLANDS: Normal size and morphology bilaterally. No nodule, thickening, or hemorrhage. No periadrenal stranding. KIDNEYS: Bilateral perinephric soft tissue stranding, nonspecific. LYMPH NODES: Upper limits of normal lymph nodes within the upper abdomen, likely reactive. VASCULATURE: Unremarkable. PERITONEUM: No ascites. ABDOMINAL WALL: No hernia. No mass. BOWEL: Grossly unremarkable. No bowel obstruction. BONES: Lumbar scoliosis with convexity towards the left. No acute abnormality or worrisome osseous lesion. SOFT TISSUES: Unremarkable. MISCELLANEOUS: Subsegmental atelectasis identified within the lung bases. IMPRESSION: 1. Status post cholecystectomy. Choledocholithiasis with dilated common bile duct measuring up to 1.6 cm and stones up to 8 mm in the mid to distal duct. 2. 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 Electronically signed by: Waddell Calk MD 01/08/2024 05:19 AM EST RP Workstation: [id number]   US  Abdomen Limited RUQ (LIVER/GB) Result Date: 01/07/2024 CLINICAL DATA:  Right upper quadrant abdominal pain. Obstructive transaminitis. History of cholecystectomy. EXAM: ULTRASOUND ABDOMEN LIMITED RIGHT UPPER QUADRANT COMPARISON:  Ultrasound 07/31/2023.  CT 09/17/2023. FINDINGS: Gallbladder: Surgically absent.  No Murphy sign in the region. Common bile duct: Diameter: Dilated biliary ductal tree with the common duct measuring 13.8  mm. Intrahepatic ductal dilatation present as well. Liver: Heterogeneous echotexture without definite focal lesion. Findings probably relate to steatosis. Portal vein is patent on color Doppler imaging with normal direction of blood flow towards the liver.  Other: None. IMPRESSION: 1. Previous cholecystectomy. Dilated biliary ductal tree with the common duct measuring 13.8 mm. Intrahepatic ductal dilatation present as well. Similar to prior recent examinations. This ductal dilatation could be due to postsurgical dilatation in an elderly patient, but the possibility of a distal common duct obstruction is not excluded. 2. Heterogeneous echotexture of the liver probably related to steatosis. Electronically Signed   By: Oneil Officer M.D.   On: 01/07/2024 16:47       Assessment / Plan:    #36 73 year old white female admitted with altered mental status after an episode of abdominal discomfort nausea vomiting followed by shaking chills. Met sepsis criteria on admission Noted to have elevated LFTs and subsequent ultrasound showed dilated common bile duct, status post cholecystectomy MRI/MRCP confirmed multiple common bile duct stones and dilated CBD  Blood cultures positive E. Coli/Enterococcus  Patient continues on Zosyn and vancomycin  Status post ERCP yesterday with sphincterotomy and extraction of 14 common bile duct stones.  She did have pus draining from the duct which continued after stone extraction and therefore temporary plastic CBD stent placed  She is feeling much better overall abdominal discomfort much improved, eating without difficulty, Tmax 99.2  #2 right lower lobe pneumonia question aspiration related  Plan ID seeing today to help guide antibiotics-ration of IV antibiotics then recommendations for oral antibiotics to complete course at home 2D echo pending today Patient will be scheduled on an outpatient basis for biliary stent removal in 2 months with Dr. Wilhelmenia GI will continue to follow along     Principal Problem:   Polymicrobial bacteremia Active Problems:   Cholangitis (HCC)   Abnormal LFTs   Choledocholithiasis   Bacteremia due to Gram-negative bacteria   Abdominal pain, RUQ   Erosive gastritis     LOS: 1  day   Amy EsterwoodPA-C  01/09/2024, 12:03 PM   Attending physician's note   I personally saw the patient and performed a substantive portion of the medical decision making process for this encounter (including a complete performance of the key components : MDM, Hx and Exam), in conjunction with the APP.  I agree with the APP's note, impression, and  the management plan for the number and complexity of problems addressed at the encounter for the patient and take responsibility for that plan with its inherent risk of complications, morbidity, or mortality with additional input as follows.      73 year old female with cholangitis secondary to biliary obstruction with choledocholithiasis, pneumonia, polymicrobial bacteremia sepsis S/p ERCP with sphincterotomy and removal of CBD stones by Dr. Wilhelmenia January 08, 2024 Patient feels better overall, no longer has abdominal pain or nausea. She is tolerating diet She is on Zosyn and vancomycin, antibiotic therapy per ID, tentatively planning 6-week therapy.  She is planning to undergo TEE tomorrow by cardiology  Will arrange GI office follow-up and biliary stent removal in 2 months with Dr. Wilhelmenia  Inpatient GI available if needed, please call with any questions   I have spent >35 minutes of patient care (this includes precharting, chart review, review of results, face-to-face time used for counseling as well as treatment plan and follow-up. The patient was provided an opportunity to ask questions and all were answered. The patient agreed with the plan and demonstrated an  understanding of the instructions.   LOIS Wilkie Mcgee , MD [phone]

## 2024-01-09 NOTE — Anesthesia Postprocedure Evaluation (Signed)
 Anesthesia Post Note  Patient: Molly Myers  Procedure(s) Performed: ERCP, WITH INTERVENTION IF INDICATED     Patient location during evaluation: PACU Anesthesia Type: General Level of consciousness: awake and alert Pain management: pain level controlled Vital Signs Assessment: post-procedure vital signs reviewed and stable Respiratory status: spontaneous breathing, nonlabored ventilation, respiratory function stable and patient connected to nasal cannula oxygen Cardiovascular status: blood pressure returned to baseline and stable Postop Assessment: no apparent nausea or vomiting Anesthetic complications: no   No notable events documented.  Last Vitals:  Vitals:   01/09/24 0100 01/09/24 0309  BP: 110/64 133/76  Pulse:    Resp:  18  Temp: 36.7 C 36.7 C  SpO2:      Last Pain:  Vitals:   01/09/24 0552  TempSrc:   PainSc: 9                  Lynwood MARLA Cornea

## 2024-01-09 NOTE — Progress Notes (Addendum)
 PROGRESS NOTE    Molly Myers  FMW:[id number] DOB: [birth year] DOA: 01/07/2024 PCP: Clarice Nottingham, MD  Chief Complaint  Patient presents with   Altered Mental Status    Brief Narrative:   73 yo with hx cholecystectomy, asthma, tobacco abuse, hypothyroidism, chronic back pain who presented with altered mental status and was found to have sepsis in the setting of cholangitis and pneumonia.    Now s/p ERCP, cholodocholithiasis noted.  Removal by biliary sphincterotomy and balloon sphincteroplasty.  Biliary stent placed to ensure patency.    ID following now for polymicrobial bacteremia, echo and repeat blood cultures pending.    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  Erosive Gastritis Noted on ERCP - PPI daily  Follow path  Acute Metabolic Encephalopathy Resolved today Delirium precautions - due to above  Asthma No symptoms Continue nebs  Hypothyroidism  TSH is wnl Unclear dose    Thrombocytopenia Component of dilution (Hb, WBC, platelets all declined) Will trend  Chronic Pain  Continue home pain meds - caution with encephalopathy above   Tobacco Abuse Noted, encourage cessation     DVT prophylaxis: SCD Code Status: full Family Communication: sig other at bedside Disposition:   Status is: Observation The patient remains OBS appropriate and will d/c before 2 midnights.   Consultants:  GI  Procedures:  ERCP  Antimicrobials:  Anti-infectives (From admission, onward)    Start     Dose/Rate Route Frequency Ordered Stop   01/08/24 2200  vancomycin  (VANCOREADY) IVPB 750 mg/150 mL        750 mg 150 mL/hr over 60 Minutes Intravenous Every 24 hours 01/08/24 0244     01/08/24 0300  vancomycin  (VANCOREADY) IVPB 1250 mg/250 mL        1,250 mg 166.7 mL/hr over 90 Minutes Intravenous  Once 01/08/24 0241 01/08/24 1949   01/07/24 2300  cefTRIAXone  (ROCEPHIN ) 1 g in sodium chloride  0.9 % 100 mL IVPB  Status:  Discontinued        1 g 200 mL/hr over 30 Minutes Intravenous Every 24 hours 01/07/24 1152 01/07/24 1328   01/07/24 1430  piperacillin -tazobactam (ZOSYN ) IVPB 3.375 g        3.375 g 12.5 mL/hr over 240 Minutes Intravenous Every 8 hours 01/07/24 1330     01/07/24 1200  azithromycin  (ZITHROMAX ) 500 mg in sodium chloride  0.9 % 250 mL IVPB        500 mg 250 mL/hr over 60 Minutes Intravenous  Once 01/07/24 1152 01/07/24 1318   01/07/24 1000  ceFEPIme  (MAXIPIME ) 2 g in sodium chloride  0.9 %  100 mL IVPB        2 g 200 mL/hr over 30 Minutes Intravenous  Once 01/07/24 0947 01/07/24 1120   01/07/24 1000  metroNIDAZOLE  (FLAGYL ) IVPB 500 mg  Status:  Discontinued        500 mg 100 mL/hr over 60 Minutes Intravenous  Once 01/07/24 0947 01/07/24 1155   01/07/24 1000  vancomycin  (VANCOCIN ) IVPB 1000 mg/200 mL premix  Status:  Discontinued        1,000 mg 200 mL/hr over 60 Minutes Intravenous  Once 01/07/24 0947 01/07/24 1151       Subjective: Sig other at  bedside Feeling better today, disappointed to have to remain hospitalized another day  Objective: Vitals:   01/08/24 2004 01/09/24 0100 01/09/24 0309 01/09/24 0922  BP: (!) 149/73 110/64 133/76 128/71  Pulse: 75   69  Resp: 18  18 16   Temp: 98.2 F (36.8 C) 98 F (36.7 C) 98 F (36.7 C) 97.8 F (36.6 C)  TempSrc: Oral Oral Oral Oral  SpO2:      Weight:      Height:        Intake/Output Summary (Last 24 hours) at 01/09/2024 1100 Last data filed at 01/09/2024 0424 Gross per 24 hour  Intake 298.35 ml  Output 0 ml  Net 298.35 ml   Filed Weights   01/07/24 0936 01/08/24 1250  Weight: 58.1 kg 58.1 kg    Examination:  General: No acute distress. Cardiovascular: RRR Lungs: unlabored Abdomen: diffuse abdominal TTP, improved from yesterday Neurological: Alert and oriented 3. Moves all extremities 4 with equal strength. Cranial nerves II through XII grossly intact. Extremities: No clubbing or cyanosis. No edema   Data Reviewed: I have personally reviewed following labs and imaging studies  CBC: Recent Labs  Lab 01/07/24 1020 01/07/24 1030 01/07/24 1031 01/08/24 0632 01/09/24 0437  WBC 13.9*  --   --  16.0* 9.8  NEUTROABS 12.8*  --   --   --  8.0*  HGB 15.6* 15.6* 16.0* 13.6 13.0  HCT 46.0 46.0 47.0* 40.3 38.2  MCV 84.4  --   --  86.1 84.3  PLT 184  --   --  124* 117*    Basic Metabolic Panel: Recent Labs  Lab 01/07/24 1020 01/07/24 1030 01/07/24 1031 01/08/24 0632 01/09/24 0437  NA 138 138 136 138 136  K 3.1* 3.1* 3.1* 4.6 4.4  CL 101 98  --  106 108  CO2 22  --   --  23 21*  GLUCOSE 90 96  --  75 146*  BUN 12 13  --  20 21  CREATININE 0.97 1.00  --  1.07* 0.92  CALCIUM 9.4  --   --  7.8* 7.8*  MG  --   --   --   --  2.1  PHOS  --   --   --   --  2.3*    GFR: Estimated Creatinine Clearance: 44.4 mL/min (by C-G formula based on SCr of 0.92 mg/dL).  Liver Function Tests: Recent Labs  Lab 01/07/24 1020 01/08/24 0632 01/09/24 0437  AST 135*  77* 39  ALT 77* 53* 39  ALKPHOS 137* 98 110  BILITOT 7.5* 6.8* 3.1*  PROT 6.3* 5.3* 5.7*  ALBUMIN 3.4* 2.6* 2.6*    CBG: No results for input(s): GLUCAP in the last 168 hours.   Recent Results (from the past 240 hours)  Resp panel by RT-PCR (RSV, Flu Analiah Drum&B, Covid) Anterior Nasal Swab  Status: None   Collection Time: 01/07/24  9:47 AM   Specimen: Anterior Nasal Swab  Result Value Ref Range Status   SARS Coronavirus 2 by RT PCR NEGATIVE NEGATIVE Final   Influenza Django Nguyen by PCR NEGATIVE NEGATIVE Final   Influenza B by PCR NEGATIVE NEGATIVE Final    Comment: (NOTE) The Xpert Xpress SARS-CoV-2/FLU/RSV plus assay is intended as an aid in the diagnosis of influenza from Nasopharyngeal swab specimens and should not be used as Latreece Mochizuki sole basis for treatment. Nasal washings and aspirates are unacceptable for Xpert Xpress SARS-CoV-2/FLU/RSV testing.  Fact Sheet for Patients: bloggercourse.com  Fact Sheet for Healthcare Providers: seriousbroker.it  This test is not yet approved or cleared by the [place]  FDA and has been authorized for detection and/or diagnosis of SARS-CoV-2 by FDA under an Emergency Use Authorization (EUA). This EUA will remain in effect (meaning this test can be used) for the duration of the COVID-19 declaration under Section 564(b)(1) of the Act, 21 U.S.C. section 360bbb-3(b)(1), unless the authorization is terminated or revoked.     Resp Syncytial Virus by PCR NEGATIVE NEGATIVE Final    Comment: (NOTE) Fact Sheet for Patients: bloggercourse.com  Fact Sheet for Healthcare Providers: seriousbroker.it  This test is not yet approved or cleared by the [place]  FDA and has been authorized for detection and/or diagnosis of SARS-CoV-2 by FDA under an Emergency Use Authorization (EUA). This EUA will remain in effect (meaning this test can be used) for the  duration of the COVID-19 declaration under Section 564(b)(1) of the Act, 21 U.S.C. section 360bbb-3(b)(1), unless the authorization is terminated or revoked.  Performed at [facility], [address], [place], [place] [address]   Blood Culture (routine x 2)     Status: Abnormal (Preliminary result)   Collection Time: 01/07/24  9:47 AM   Specimen: BLOOD LEFT ARM  Result Value Ref Range Status   Specimen Description BLOOD LEFT ARM  Final   Special Requests   Final    BOTTLES DRAWN AEROBIC AND ANAEROBIC Blood Culture adequate volume   Culture  Setup Time   Final    GRAM NEGATIVE RODS GRAM POSITIVE COCCI IN PAIRS IN CHAINS IN BOTH AEROBIC AND ANAEROBIC BOTTLES CRITICAL RESULT CALLED TO, READ BACK BY AND VERIFIED WITH: PHARMD J LEDFORD 01/08/2024 @ 0229 BY AB    Culture (Lamond Glantz)  Final    ESCHERICHIA COLI ENTEROCOCCUS FAECIUM SUSCEPTIBILITIES TO FOLLOW Performed at [facility], [address], [place], [place] [address]    Report Status PENDING  Incomplete  Blood Culture ID Panel (Reflexed)     Status: Abnormal   Collection Time: 01/07/24  9:47 AM  Result Value Ref Range Status   Enterococcus faecalis NOT DETECTED NOT DETECTED Final   Enterococcus Faecium DETECTED (Cambre Matson) NOT DETECTED Final    Comment: CRITICAL RESULT CALLED TO, READ BACK BY AND VERIFIED WITH: PHARMD J LEDFORD 01/08/2024 @ 0229 BY AB    Listeria monocytogenes NOT DETECTED NOT DETECTED Final   Staphylococcus species NOT DETECTED NOT DETECTED Final   Staphylococcus aureus (BCID) NOT DETECTED NOT DETECTED Final   Staphylococcus epidermidis NOT DETECTED NOT DETECTED Final   Staphylococcus lugdunensis NOT DETECTED NOT DETECTED Final   Streptococcus species NOT DETECTED NOT DETECTED Final   Streptococcus agalactiae NOT DETECTED NOT DETECTED Final   Streptococcus pneumoniae NOT DETECTED NOT DETECTED Final   Streptococcus pyogenes NOT DETECTED NOT DETECTED Final   Naavya Postma.calcoaceticus-baumannii NOT DETECTED NOT  DETECTED Final   Bacteroides fragilis  NOT DETECTED NOT DETECTED Final   Enterobacterales DETECTED (Hailee Hollick) NOT DETECTED Final    Comment: Enterobacterales represent Torrie Namba large order of gram negative bacteria, not Nolia Tschantz single organism. CRITICAL RESULT CALLED TO, READ BACK BY AND VERIFIED WITH: PHARMD J LEDFORD 01/08/2024 @ 0229 BY AB    Enterobacter cloacae complex NOT DETECTED NOT DETECTED Final   Escherichia coli DETECTED (Roxan Yamamoto) NOT DETECTED Final    Comment: CRITICAL RESULT CALLED TO, READ BACK BY AND VERIFIED WITH: PHARMD J LEDFORD 01/08/2024 @ 0229 BY AB    Klebsiella aerogenes NOT DETECTED NOT DETECTED Final   Klebsiella oxytoca NOT DETECTED NOT DETECTED Final   Klebsiella pneumoniae NOT DETECTED NOT DETECTED Final   Proteus species NOT DETECTED NOT DETECTED Final   Salmonella species NOT DETECTED NOT DETECTED Final   Serratia marcescens NOT DETECTED NOT DETECTED Final   Haemophilus influenzae NOT DETECTED NOT DETECTED Final   Neisseria meningitidis NOT DETECTED NOT DETECTED Final   Pseudomonas aeruginosa NOT DETECTED NOT DETECTED Final   Stenotrophomonas maltophilia NOT DETECTED NOT DETECTED Final   Candida albicans NOT DETECTED NOT DETECTED Final   Candida auris NOT DETECTED NOT DETECTED Final   Candida glabrata NOT DETECTED NOT DETECTED Final   Candida krusei NOT DETECTED NOT DETECTED Final   Candida parapsilosis NOT DETECTED NOT DETECTED Final   Candida tropicalis NOT DETECTED NOT DETECTED Final   Cryptococcus neoformans/gattii NOT DETECTED NOT DETECTED Final   CTX-M ESBL NOT DETECTED NOT DETECTED Final   Carbapenem resistance IMP NOT DETECTED NOT DETECTED Final   Carbapenem resistance KPC NOT DETECTED NOT DETECTED Final   Carbapenem resistance NDM NOT DETECTED NOT DETECTED Final   Carbapenem resist OXA 48 LIKE NOT DETECTED NOT DETECTED Final   Vancomycin  resistance NOT DETECTED NOT DETECTED Final   Carbapenem resistance VIM NOT DETECTED NOT DETECTED Final    Comment: Performed at [facility], [address] [place], [place] [address]  Blood Culture (routine x 2)     Status: None (Preliminary result)   Collection Time: 01/07/24  9:52 AM   Specimen: BLOOD RIGHT ARM  Result Value Ref Range Status   Specimen Description BLOOD RIGHT ARM  Final   Special Requests   Final    BOTTLES DRAWN AEROBIC AND ANAEROBIC Blood Culture adequate volume   Culture  Setup Time   Final    GRAM NEGATIVE RODS IN BOTH AEROBIC AND ANAEROBIC BOTTLES GRAM POSITIVE COCCI IN PAIRS IN CHAINS AEROBIC BOTTLE ONLY    Culture   Final    GRAM NEGATIVE RODS GRAM POSITIVE COCCI IDENTIFICATION TO FOLLOW Performed at [facility], [address] [place], [place] [address]    Report Status PENDING  Incomplete  Respiratory (~20 pathogens) panel by PCR     Status: None   Collection Time: 01/07/24  8:46 PM  Result Value Ref Range Status   Adenovirus NOT DETECTED NOT DETECTED Final   Coronavirus 229E NOT DETECTED NOT DETECTED Final    Comment: (NOTE) The Coronavirus on the Respiratory Panel, DOES NOT test for the novel  Coronavirus (2019 nCoV)    Coronavirus HKU1 NOT DETECTED NOT DETECTED Final   Coronavirus NL63 NOT DETECTED NOT DETECTED Final   Coronavirus OC43 NOT DETECTED NOT DETECTED Final   Metapneumovirus NOT DETECTED NOT DETECTED Final   Rhinovirus / Enterovirus NOT DETECTED NOT DETECTED Final   Influenza Yousuf Ager NOT DETECTED NOT DETECTED Final   Influenza B NOT DETECTED NOT DETECTED Final   Parainfluenza Virus 1 NOT  DETECTED NOT DETECTED Final   Parainfluenza Virus 2 NOT DETECTED NOT DETECTED Final   Parainfluenza Virus 3 NOT DETECTED NOT DETECTED Final   Parainfluenza Virus 4 NOT DETECTED NOT DETECTED Final   Respiratory Syncytial Virus NOT DETECTED NOT DETECTED Final   Bordetella pertussis NOT DETECTED NOT DETECTED Final   Bordetella Parapertussis NOT DETECTED NOT DETECTED Final   Chlamydophila pneumoniae NOT DETECTED NOT DETECTED Final   Mycoplasma pneumoniae NOT DETECTED NOT  DETECTED Final    Comment: Performed at [facility], [address] [place], [place] [address]  Culture, blood (Routine X 2) w Reflex to ID Panel     Status: None (Preliminary result)   Collection Time: 01/08/24  7:25 PM   Specimen: BLOOD  Result Value Ref Range Status   Specimen Description BLOOD SITE NOT SPECIFIED  Final   Special Requests   Final    BOTTLES DRAWN AEROBIC AND ANAEROBIC Blood Culture results may not be optimal due to an inadequate volume of blood received in culture bottles   Culture   Final    NO GROWTH < 12 HOURS Performed at [facility], [address] [place], [place] [address]    Report Status PENDING  Incomplete  Culture, blood (Routine X 2) w Reflex to ID Panel     Status: None (Preliminary result)   Collection Time: 01/08/24  7:25 PM   Specimen: BLOOD  Result Value Ref Range Status   Specimen Description BLOOD SITE NOT SPECIFIED  Final   Special Requests   Final    BOTTLES DRAWN AEROBIC AND ANAEROBIC Blood Culture results may not be optimal due to an inadequate volume of blood received in culture bottles   Culture   Final    NO GROWTH < 12 HOURS Performed at [facility], [address] [place], [place] [address]    Report Status PENDING  Incomplete         Radiology Studies: MR ABDOMEN MRCP W WO CONTAST Result Date: 01/08/2024 EXAM: MRCP WITHOUT AND WITH IV CONTRAST 01/07/2024 10:41:25 PM TECHNIQUE: Multisequence, multiplanar magnetic resonance images of the abdomen without and with intravenous contrast. MRCP sequences were performed. CONTRAST: 6 mL of GADAVIST. COMPARISON: US  Abdomen limited 01/07/2024 3:59:47 PM. CLINICAL HISTORY: Biliary obstruction suspected (Ped 0-17y). FINDINGS: LIVER: No suspicious focal liver lesion identified. Trace fluid along the inferior margin of right lobe of liver. GALLBLADDER AND BILIARY SYSTEM: Status post cholecystectomy. The common bile duct is dilated measuring up to 1.6 cm. There are numerous  stones filling the mid and distal common bile duct which measure up to 8 mm. Moderate intrahepatic bile duct dilatation. SPLEEN: The spleen is within normal limits in size and appearance. PANCREAS/PANCREATIC DUCT: Possible edema involving the head and uncinate process of the pancreas identified with Libby Goehring small volume of free fluid along the pancreaticoduodenal groove. No pancreatic ductal dilatation. ADRENAL GLANDS: Normal size and morphology bilaterally. No nodule, thickening, or hemorrhage. No periadrenal stranding. KIDNEYS: Bilateral perinephric soft tissue stranding, nonspecific. LYMPH NODES: Upper limits of normal lymph nodes within the upper abdomen, likely reactive. VASCULATURE: Unremarkable. PERITONEUM: No ascites. ABDOMINAL WALL: No hernia. No mass. BOWEL: Grossly unremarkable. No bowel obstruction. BONES: Lumbar scoliosis with convexity towards the left. No acute abnormality or worrisome osseous lesion. SOFT TISSUES: Unremarkable. MISCELLANEOUS: Subsegmental atelectasis identified within the lung bases. IMPRESSION: 1. Status post cholecystectomy. Choledocholithiasis with dilated common bile duct measuring up to 1.6 cm and stones up to 8 mm in the mid to  distal duct. 2. 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 Electronically signed by: Waddell Calk MD 01/08/2024 05:19 AM EST RP Workstation: [id number]   MR 3D Recon At Scanner Result Date: 01/08/2024 EXAM: MRCP WITHOUT AND WITH IV CONTRAST 01/07/2024 10:41:25 PM TECHNIQUE: Multisequence, multiplanar magnetic resonance images of the abdomen without and with intravenous contrast. MRCP sequences were performed. CONTRAST: 6 mL of GADAVIST . COMPARISON: US  Abdomen limited 01/07/2024 3:59:47 PM. CLINICAL HISTORY: Biliary obstruction suspected (Ped 0-17y). FINDINGS: LIVER: No suspicious focal liver lesion identified. Trace fluid along the inferior margin of right  lobe of liver. GALLBLADDER AND BILIARY SYSTEM: Status post cholecystectomy. The common bile duct is dilated measuring up to 1.6 cm. There are numerous stones filling the mid and distal common bile duct which measure up to 8 mm. Moderate intrahepatic bile duct dilatation. SPLEEN: The spleen is within normal limits in size and appearance. PANCREAS/PANCREATIC DUCT: Possible edema involving the head and uncinate process of the pancreas identified with Meng Winterton small volume of free fluid along the pancreaticoduodenal groove. No pancreatic ductal dilatation. ADRENAL GLANDS: Normal size and morphology bilaterally. No nodule, thickening, or hemorrhage. No periadrenal stranding. KIDNEYS: Bilateral perinephric soft tissue stranding, nonspecific. LYMPH NODES: Upper limits of normal lymph nodes within the upper abdomen, likely reactive. VASCULATURE: Unremarkable. PERITONEUM: No ascites. ABDOMINAL WALL: No hernia. No mass. BOWEL: Grossly unremarkable. No bowel obstruction. BONES: Lumbar scoliosis with convexity towards the left. No acute abnormality or worrisome osseous lesion. SOFT TISSUES: Unremarkable. MISCELLANEOUS: Subsegmental atelectasis identified within the lung bases. IMPRESSION: 1. Status post cholecystectomy. Choledocholithiasis with dilated common bile duct measuring up to 1.6 cm and stones up to 8 mm in the mid to distal duct. 2. 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 Electronically signed by: Waddell Calk MD 01/08/2024 05:19 AM EST RP Workstation: [id number]   US  Abdomen Limited RUQ (LIVER/GB) Result Date: 01/07/2024 CLINICAL DATA:  Right upper quadrant abdominal pain. Obstructive transaminitis. History of cholecystectomy. EXAM: ULTRASOUND ABDOMEN LIMITED RIGHT UPPER QUADRANT COMPARISON:  Ultrasound 07/31/2023.  CT 09/17/2023. FINDINGS: Gallbladder: Surgically absent.  No Murphy sign in the region. Common bile  duct: Diameter: Dilated biliary ductal tree with the common duct measuring 13.8 mm. Intrahepatic ductal dilatation present as well. Liver: Heterogeneous echotexture without definite focal lesion. Findings probably relate to steatosis. Portal vein is patent on color Doppler imaging with normal direction of blood flow towards the liver. Other: None. IMPRESSION: 1. Previous cholecystectomy. Dilated biliary ductal tree with the common duct measuring 13.8 mm. Intrahepatic ductal dilatation present as well. Similar to prior recent examinations. This ductal dilatation could be due to postsurgical dilatation in an elderly patient, but the possibility of Kemiyah Tarazon distal common duct obstruction is not excluded. 2. Heterogeneous echotexture of the liver probably related to steatosis. Electronically Signed   By: Oneil Officer M.D.   On: 01/07/2024 16:47        Scheduled Meds:  fluticasone  furoate-vilanterol  1 puff Inhalation Daily   morphine   30 mg Oral Q12H   nicotine   21 mg Transdermal Daily   pantoprazole   40 mg Oral Daily   sodium chloride  flush  3 mL Intravenous Q12H   Continuous Infusions:  piperacillin -tazobactam (ZOSYN )  IV 3.375 g (01/09/24 0555)   vancomycin  750 mg (01/08/24 2217)     LOS: 1 day    Time spent: over 30 min     Meliton Monte, MD [facility]  To contact the attending provider between 7A-7P or the covering provider during after hours 7P-7A, please log into the web site [email or web address] and access using universal [place] password for that web site. If you do not have the password, please call the hospital operator.  01/09/2024, 11:00 AM

## 2024-01-09 NOTE — Plan of Care (Signed)

## 2024-01-09 NOTE — Plan of Care (Signed)

## 2024-01-09 NOTE — Progress Notes (Addendum)
 Brief KD Note -  ERCP results noted Status post ERCP yesterday with sphincterotomy, and extraction of multiple common bile duct stones (14), there was active pus draining from the duct and this did not stop even after stone extraction so 10 French/5 cm plastic biliary stent was placed.  Repeat BCx ordered and pending  Will order TTE with enterococcus involved.  Continue vanc / zosyn while we await micro updates.   Will come back to see her in AM 12/03.  Plan discussed with Dr Bianca.   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 Recommend TEE for clarity.  D/W Dr. Perri

## 2024-01-09 NOTE — Progress Notes (Signed)
   [facility] has been requested to perform a transesophageal echocardiogram on [facility] for bacteremia.    The TTE showed a possible mobile mass on the aortic valve.  A TEE was recommended.  The patient had an ERCP done on 01/08/2024 and no gross lesions were seen in the esophagus.   The patient does NOT have any absolute or relative contraindications to a Transesophageal Echocardiogram (TEE).  The patient has: No other conditions that may impact this procedure.    After careful review of history and examination, the risks and benefits of transesophageal echocardiogram have been explained including risks of esophageal damage, perforation (1:10,000 risk), bleeding, pharyngeal hematoma as well as other potential complications associated with conscious sedation including aspiration, arrhythmia, respiratory failure and death. Alternatives to treatment were discussed, questions were answered. Patient is willing to proceed.   Bonney Morse Clause, PA-C  01/09/2024 3:20 PM

## 2024-01-09 NOTE — Progress Notes (Signed)
  Echocardiogram 2D Echocardiogram has been performed.  Molly Myers [facility] 01/09/2024, 11:32 AM

## 2024-01-09 NOTE — H&P (View-Only) (Signed)
 PROGRESS NOTE    Molly Myers  FMW:[id number] DOB: 1950/06/28 DOA: 01/07/2024 PCP: Clarice Nottingham, MD  Chief Complaint  Patient presents with   Altered Mental Status    Brief Narrative:   73 yo with hx cholecystectomy, asthma, tobacco abuse, hypothyroidism, chronic back pain who presented with altered mental status and was found to have sepsis in the setting of cholangitis and pneumonia.    Now s/p ERCP, cholodocholithiasis noted.  Removal by biliary sphincterotomy and balloon sphincteroplasty.  Biliary stent placed to ensure patency.    ID following now for polymicrobial bacteremia, echo and repeat blood cultures pending.    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  Erosive Gastritis Noted on ERCP - PPI daily  Follow path  Acute Metabolic Encephalopathy Resolved today Delirium precautions - due to above  Asthma No symptoms Continue nebs  Hypothyroidism  TSH is wnl Unclear dose    Thrombocytopenia Component of dilution (Hb, WBC, platelets all declined) Will trend  Chronic Pain  Continue home pain meds - caution with encephalopathy above   Tobacco Abuse Noted, encourage cessation     DVT prophylaxis: SCD Code Status: full Family Communication: sig other at bedside Disposition:   Status is: Observation The patient remains OBS appropriate and will d/c before 2 midnights.   Consultants:  GI  Procedures:  ERCP  Antimicrobials:  Anti-infectives (From admission, onward)    Start     Dose/Rate Route Frequency Ordered Stop   01/08/24 2200  vancomycin (VANCOREADY) IVPB 750 mg/150 mL        750 mg 150 mL/hr over 60 Minutes Intravenous Every 24 hours 01/08/24 0244     01/08/24 0300  vancomycin (VANCOREADY) IVPB 1250 mg/250 mL        1,250 mg 166.7 mL/hr over 90 Minutes Intravenous  Once 01/08/24 0241 01/08/24 1949   01/07/24 2300  cefTRIAXone (ROCEPHIN) 1 g in sodium chloride 0.9 % 100 mL IVPB  Status:  Discontinued        1 g 200 mL/hr over 30 Minutes Intravenous Every 24 hours 01/07/24 1152 01/07/24 1328   01/07/24 1430  piperacillin-tazobactam (ZOSYN) IVPB 3.375 g        3.375 g 12.5 mL/hr over 240 Minutes Intravenous Every 8 hours 01/07/24 1330     01/07/24 1200  azithromycin (ZITHROMAX) 500 mg in sodium chloride 0.9 % 250 mL IVPB        500 mg 250 mL/hr over 60 Minutes Intravenous  Once 01/07/24 1152 01/07/24 1318   01/07/24 1000  ceFEPIme (MAXIPIME) 2 g in sodium chloride 0.9 %  100 mL IVPB        2 g 200 mL/hr over 30 Minutes Intravenous  Once 01/07/24 0947 01/07/24 1120   01/07/24 1000  metroNIDAZOLE (FLAGYL) IVPB 500 mg  Status:  Discontinued        500 mg 100 mL/hr over 60 Minutes Intravenous  Once 01/07/24 0947 01/07/24 1155   01/07/24 1000  vancomycin (VANCOCIN) IVPB 1000 mg/200 mL premix  Status:  Discontinued        1,000 mg 200 mL/hr over 60 Minutes Intravenous  Once 01/07/24 0947 01/07/24 1151       Subjective: Sig other at  bedside Feeling better today, disappointed to have to remain hospitalized another day  Objective: Vitals:   01/08/24 2004 01/09/24 0100 01/09/24 0309 01/09/24 0922  BP: (!) 149/73 110/64 133/76 128/71  Pulse: 75   69  Resp: 18  18 16   Temp: 98.2 F (36.8 C) 98 F (36.7 C) 98 F (36.7 C) 97.8 F (36.6 C)  TempSrc: Oral Oral Oral Oral  SpO2:      Weight:      Height:        Intake/Output Summary (Last 24 hours) at 01/09/2024 1100 Last data filed at 01/09/2024 0424 Gross per 24 hour  Intake 298.35 ml  Output 0 ml  Net 298.35 ml   Filed Weights   01/07/24 0936 01/08/24 1250  Weight: 58.1 kg 58.1 kg    Examination:  General: No acute distress. Cardiovascular: RRR Lungs: unlabored Abdomen: diffuse abdominal TTP, improved from yesterday Neurological: Alert and oriented 3. Moves all extremities 4 with equal strength. Cranial nerves II through XII grossly intact. Extremities: No clubbing or cyanosis. No edema   Data Reviewed: I have personally reviewed following labs and imaging studies  CBC: Recent Labs  Lab 01/07/24 1020 01/07/24 1030 01/07/24 1031 01/08/24 0632 01/09/24 0437  WBC 13.9*  --   --  16.0* 9.8  NEUTROABS 12.8*  --   --   --  8.0*  HGB 15.6* 15.6* 16.0* 13.6 13.0  HCT 46.0 46.0 47.0* 40.3 38.2  MCV 84.4  --   --  86.1 84.3  PLT 184  --   --  124* 117*    Basic Metabolic Panel: Recent Labs  Lab 01/07/24 1020 01/07/24 1030 01/07/24 1031 01/08/24 0632 01/09/24 0437  NA 138 138 136 138 136  K 3.1* 3.1* 3.1* 4.6 4.4  CL 101 98  --  106 108  CO2 22  --   --  23 21*  GLUCOSE 90 96  --  75 146*  BUN 12 13  --  20 21  CREATININE 0.97 1.00  --  1.07* 0.92  CALCIUM 9.4  --   --  7.8* 7.8*  MG  --   --   --   --  2.1  PHOS  --   --   --   --  2.3*    GFR: Estimated Creatinine Clearance: 44.4 mL/min (by C-G formula based on SCr of 0.92 mg/dL).  Liver Function Tests: Recent Labs  Lab 01/07/24 1020 01/08/24 0632 01/09/24 0437  AST 135*  77* 39  ALT 77* 53* 39  ALKPHOS 137* 98 110  BILITOT 7.5* 6.8* 3.1*  PROT 6.3* 5.3* 5.7*  ALBUMIN 3.4* 2.6* 2.6*    CBG: No results for input(s): GLUCAP in the last 168 hours.   Recent Results (from the past 240 hours)  Resp panel by RT-PCR (RSV, Flu Rodneisha Bonnet&B, Covid) Anterior Nasal Swab  Status: None   Collection Time: 01/07/24  9:47 AM   Specimen: Anterior Nasal Swab  Result Value Ref Range Status   SARS Coronavirus 2 by RT PCR NEGATIVE NEGATIVE Final   Influenza Django Nguyen by PCR NEGATIVE NEGATIVE Final   Influenza B by PCR NEGATIVE NEGATIVE Final    Comment: (NOTE) The Xpert Xpress SARS-CoV-2/FLU/RSV plus assay is intended as an aid in the diagnosis of influenza from Nasopharyngeal swab specimens and should not be used as Latreece Mochizuki sole basis for treatment. Nasal washings and aspirates are unacceptable for Xpert Xpress SARS-CoV-2/FLU/RSV testing.  Fact Sheet for Patients: bloggercourse.com  Fact Sheet for Healthcare Providers: seriousbroker.it  This test is not yet approved or cleared by the [place]  FDA and has been authorized for detection and/or diagnosis of SARS-CoV-2 by FDA under an Emergency Use Authorization (EUA). This EUA will remain in effect (meaning this test can be used) for the duration of the COVID-19 declaration under Section 564(b)(1) of the Act, 21 U.S.C. section 360bbb-3(b)(1), unless the authorization is terminated or revoked.     Resp Syncytial Virus by PCR NEGATIVE NEGATIVE Final    Comment: (NOTE) Fact Sheet for Patients: bloggercourse.com  Fact Sheet for Healthcare Providers: seriousbroker.it  This test is not yet approved or cleared by the [place]  FDA and has been authorized for detection and/or diagnosis of SARS-CoV-2 by FDA under an Emergency Use Authorization (EUA). This EUA will remain in effect (meaning this test can be used) for the  duration of the COVID-19 declaration under Section 564(b)(1) of the Act, 21 U.S.C. section 360bbb-3(b)(1), unless the authorization is terminated or revoked.  Performed at [facility], [address], [place], [place] [address]   Blood Culture (routine x 2)     Status: Abnormal (Preliminary result)   Collection Time: 01/07/24  9:47 AM   Specimen: BLOOD LEFT ARM  Result Value Ref Range Status   Specimen Description BLOOD LEFT ARM  Final   Special Requests   Final    BOTTLES DRAWN AEROBIC AND ANAEROBIC Blood Culture adequate volume   Culture  Setup Time   Final    GRAM NEGATIVE RODS GRAM POSITIVE COCCI IN PAIRS IN CHAINS IN BOTH AEROBIC AND ANAEROBIC BOTTLES CRITICAL RESULT CALLED TO, READ BACK BY AND VERIFIED WITH: PHARMD J LEDFORD 01/08/2024 @ 0229 BY AB    Culture (Lamond Glantz)  Final    ESCHERICHIA COLI ENTEROCOCCUS FAECIUM SUSCEPTIBILITIES TO FOLLOW Performed at [facility], [address], [place], [place] [address]    Report Status PENDING  Incomplete  Blood Culture ID Panel (Reflexed)     Status: Abnormal   Collection Time: 01/07/24  9:47 AM  Result Value Ref Range Status   Enterococcus faecalis NOT DETECTED NOT DETECTED Final   Enterococcus Faecium DETECTED (Cambre Matson) NOT DETECTED Final    Comment: CRITICAL RESULT CALLED TO, READ BACK BY AND VERIFIED WITH: PHARMD J LEDFORD 01/08/2024 @ 0229 BY AB    Listeria monocytogenes NOT DETECTED NOT DETECTED Final   Staphylococcus species NOT DETECTED NOT DETECTED Final   Staphylococcus aureus (BCID) NOT DETECTED NOT DETECTED Final   Staphylococcus epidermidis NOT DETECTED NOT DETECTED Final   Staphylococcus lugdunensis NOT DETECTED NOT DETECTED Final   Streptococcus species NOT DETECTED NOT DETECTED Final   Streptococcus agalactiae NOT DETECTED NOT DETECTED Final   Streptococcus pneumoniae NOT DETECTED NOT DETECTED Final   Streptococcus pyogenes NOT DETECTED NOT DETECTED Final   Naavya Postma.calcoaceticus-baumannii NOT DETECTED NOT  DETECTED Final   Bacteroides fragilis  NOT DETECTED NOT DETECTED Final   Enterobacterales DETECTED (Vyron Fronczak) NOT DETECTED Final    Comment: Enterobacterales represent Takeem Krotzer large order of gram negative bacteria, not Lafawn Lenoir single organism. CRITICAL RESULT CALLED TO, READ BACK BY AND VERIFIED WITH: PHARMD J LEDFORD 01/08/2024 @ 0229 BY AB    Enterobacter cloacae complex NOT DETECTED NOT DETECTED Final   Escherichia coli DETECTED (Tameron Lama) NOT DETECTED Final    Comment: CRITICAL RESULT CALLED TO, READ BACK BY AND VERIFIED WITH: PHARMD J LEDFORD 01/08/2024 @ 0229 BY AB    Klebsiella aerogenes NOT DETECTED NOT DETECTED Final   Klebsiella oxytoca NOT DETECTED NOT DETECTED Final   Klebsiella pneumoniae NOT DETECTED NOT DETECTED Final   Proteus species NOT DETECTED NOT DETECTED Final   Salmonella species NOT DETECTED NOT DETECTED Final   Serratia marcescens NOT DETECTED NOT DETECTED Final   Haemophilus influenzae NOT DETECTED NOT DETECTED Final   Neisseria meningitidis NOT DETECTED NOT DETECTED Final   Pseudomonas aeruginosa NOT DETECTED NOT DETECTED Final   Stenotrophomonas maltophilia NOT DETECTED NOT DETECTED Final   Candida albicans NOT DETECTED NOT DETECTED Final   Candida auris NOT DETECTED NOT DETECTED Final   Candida glabrata NOT DETECTED NOT DETECTED Final   Candida krusei NOT DETECTED NOT DETECTED Final   Candida parapsilosis NOT DETECTED NOT DETECTED Final   Candida tropicalis NOT DETECTED NOT DETECTED Final   Cryptococcus neoformans/gattii NOT DETECTED NOT DETECTED Final   CTX-M ESBL NOT DETECTED NOT DETECTED Final   Carbapenem resistance IMP NOT DETECTED NOT DETECTED Final   Carbapenem resistance KPC NOT DETECTED NOT DETECTED Final   Carbapenem resistance NDM NOT DETECTED NOT DETECTED Final   Carbapenem resist OXA 48 LIKE NOT DETECTED NOT DETECTED Final   Vancomycin resistance NOT DETECTED NOT DETECTED Final   Carbapenem resistance VIM NOT DETECTED NOT DETECTED Final    Comment: Performed at [facility], [address], [place], [place] [address]  Blood Culture (routine x 2)     Status: None (Preliminary result)   Collection Time: 01/07/24  9:52 AM   Specimen: BLOOD RIGHT ARM  Result Value Ref Range Status   Specimen Description BLOOD RIGHT ARM  Final   Special Requests   Final    BOTTLES DRAWN AEROBIC AND ANAEROBIC Blood Culture adequate volume   Culture  Setup Time   Final    GRAM NEGATIVE RODS IN BOTH AEROBIC AND ANAEROBIC BOTTLES GRAM POSITIVE COCCI IN PAIRS IN CHAINS AEROBIC BOTTLE ONLY    Culture   Final    GRAM NEGATIVE RODS GRAM POSITIVE COCCI IDENTIFICATION TO FOLLOW Performed at [facility], [address] [place], [place] [address]    Report Status PENDING  Incomplete  Respiratory (~20 pathogens) panel by PCR     Status: None   Collection Time: 01/07/24  8:46 PM  Result Value Ref Range Status   Adenovirus NOT DETECTED NOT DETECTED Final   Coronavirus 229E NOT DETECTED NOT DETECTED Final    Comment: (NOTE) The Coronavirus on the Respiratory Panel, DOES NOT test for the novel  Coronavirus (2019 nCoV)    Coronavirus HKU1 NOT DETECTED NOT DETECTED Final   Coronavirus NL63 NOT DETECTED NOT DETECTED Final   Coronavirus OC43 NOT DETECTED NOT DETECTED Final   Metapneumovirus NOT DETECTED NOT DETECTED Final   Rhinovirus / Enterovirus NOT DETECTED NOT DETECTED Final   Influenza Brenn Gatton NOT DETECTED NOT DETECTED Final   Influenza B NOT DETECTED NOT DETECTED Final   Parainfluenza Virus 1 NOT  DETECTED NOT DETECTED Final   Parainfluenza Virus 2 NOT DETECTED NOT DETECTED Final   Parainfluenza Virus 3 NOT DETECTED NOT DETECTED Final   Parainfluenza Virus 4 NOT DETECTED NOT DETECTED Final   Respiratory Syncytial Virus NOT DETECTED NOT DETECTED Final   Bordetella pertussis NOT DETECTED NOT DETECTED Final   Bordetella Parapertussis NOT DETECTED NOT DETECTED Final   Chlamydophila pneumoniae NOT DETECTED NOT DETECTED Final   Mycoplasma pneumoniae NOT DETECTED NOT  DETECTED Final    Comment: Performed at [facility], [address] [place], [place] [address]  Culture, blood (Routine X 2) w Reflex to ID Panel     Status: None (Preliminary result)   Collection Time: 01/08/24  7:25 PM   Specimen: BLOOD  Result Value Ref Range Status   Specimen Description BLOOD SITE NOT SPECIFIED  Final   Special Requests   Final    BOTTLES DRAWN AEROBIC AND ANAEROBIC Blood Culture results may not be optimal due to an inadequate volume of blood received in culture bottles   Culture   Final    NO GROWTH < 12 HOURS Performed at [facility], [address] [place], [place] [address]    Report Status PENDING  Incomplete  Culture, blood (Routine X 2) w Reflex to ID Panel     Status: None (Preliminary result)   Collection Time: 01/08/24  7:25 PM   Specimen: BLOOD  Result Value Ref Range Status   Specimen Description BLOOD SITE NOT SPECIFIED  Final   Special Requests   Final    BOTTLES DRAWN AEROBIC AND ANAEROBIC Blood Culture results may not be optimal due to an inadequate volume of blood received in culture bottles   Culture   Final    NO GROWTH < 12 HOURS Performed at [facility], [address] [place], [place] [address]    Report Status PENDING  Incomplete         Radiology Studies: MR ABDOMEN MRCP W WO CONTAST Result Date: 01/08/2024 EXAM: MRCP WITHOUT AND WITH IV CONTRAST 01/07/2024 10:41:25 PM TECHNIQUE: Multisequence, multiplanar magnetic resonance images of the abdomen without and with intravenous contrast. MRCP sequences were performed. CONTRAST: 6 mL of GADAVIST. COMPARISON: US  Abdomen limited 01/07/2024 3:59:47 PM. CLINICAL HISTORY: Biliary obstruction suspected (Ped 0-17y). FINDINGS: LIVER: No suspicious focal liver lesion identified. Trace fluid along the inferior margin of right lobe of liver. GALLBLADDER AND BILIARY SYSTEM: Status post cholecystectomy. The common bile duct is dilated measuring up to 1.6 cm. There are numerous  stones filling the mid and distal common bile duct which measure up to 8 mm. Moderate intrahepatic bile duct dilatation. SPLEEN: The spleen is within normal limits in size and appearance. PANCREAS/PANCREATIC DUCT: Possible edema involving the head and uncinate process of the pancreas identified with Libby Goehring small volume of free fluid along the pancreaticoduodenal groove. No pancreatic ductal dilatation. ADRENAL GLANDS: Normal size and morphology bilaterally. No nodule, thickening, or hemorrhage. No periadrenal stranding. KIDNEYS: Bilateral perinephric soft tissue stranding, nonspecific. LYMPH NODES: Upper limits of normal lymph nodes within the upper abdomen, likely reactive. VASCULATURE: Unremarkable. PERITONEUM: No ascites. ABDOMINAL WALL: No hernia. No mass. BOWEL: Grossly unremarkable. No bowel obstruction. BONES: Lumbar scoliosis with convexity towards the left. No acute abnormality or worrisome osseous lesion. SOFT TISSUES: Unremarkable. MISCELLANEOUS: Subsegmental atelectasis identified within the lung bases. IMPRESSION: 1. Status post cholecystectomy. Choledocholithiasis with dilated common bile duct measuring up to 1.6 cm and stones up to 8 mm in the mid to  distal duct. 2. 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 Electronically signed by: Waddell Calk MD 01/08/2024 05:19 AM EST RP Workstation: [id number]   MR 3D Recon At Scanner Result Date: 01/08/2024 EXAM: MRCP WITHOUT AND WITH IV CONTRAST 01/07/2024 10:41:25 PM TECHNIQUE: Multisequence, multiplanar magnetic resonance images of the abdomen without and with intravenous contrast. MRCP sequences were performed. CONTRAST: 6 mL of GADAVIST . COMPARISON: US  Abdomen limited 01/07/2024 3:59:47 PM. CLINICAL HISTORY: Biliary obstruction suspected (Ped 0-17y). FINDINGS: LIVER: No suspicious focal liver lesion identified. Trace fluid along the inferior margin of right  lobe of liver. GALLBLADDER AND BILIARY SYSTEM: Status post cholecystectomy. The common bile duct is dilated measuring up to 1.6 cm. There are numerous stones filling the mid and distal common bile duct which measure up to 8 mm. Moderate intrahepatic bile duct dilatation. SPLEEN: The spleen is within normal limits in size and appearance. PANCREAS/PANCREATIC DUCT: Possible edema involving the head and uncinate process of the pancreas identified with Meng Winterton small volume of free fluid along the pancreaticoduodenal groove. No pancreatic ductal dilatation. ADRENAL GLANDS: Normal size and morphology bilaterally. No nodule, thickening, or hemorrhage. No periadrenal stranding. KIDNEYS: Bilateral perinephric soft tissue stranding, nonspecific. LYMPH NODES: Upper limits of normal lymph nodes within the upper abdomen, likely reactive. VASCULATURE: Unremarkable. PERITONEUM: No ascites. ABDOMINAL WALL: No hernia. No mass. BOWEL: Grossly unremarkable. No bowel obstruction. BONES: Lumbar scoliosis with convexity towards the left. No acute abnormality or worrisome osseous lesion. SOFT TISSUES: Unremarkable. MISCELLANEOUS: Subsegmental atelectasis identified within the lung bases. IMPRESSION: 1. Status post cholecystectomy. Choledocholithiasis with dilated common bile duct measuring up to 1.6 cm and stones up to 8 mm in the mid to distal duct. 2. 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 Electronically signed by: Waddell Calk MD 01/08/2024 05:19 AM EST RP Workstation: [id number]   US  Abdomen Limited RUQ (LIVER/GB) Result Date: 01/07/2024 CLINICAL DATA:  Right upper quadrant abdominal pain. Obstructive transaminitis. History of cholecystectomy. EXAM: ULTRASOUND ABDOMEN LIMITED RIGHT UPPER QUADRANT COMPARISON:  Ultrasound 07/31/2023.  CT 09/17/2023. FINDINGS: Gallbladder: Surgically absent.  No Murphy sign in the region. Common bile  duct: Diameter: Dilated biliary ductal tree with the common duct measuring 13.8 mm. Intrahepatic ductal dilatation present as well. Liver: Heterogeneous echotexture without definite focal lesion. Findings probably relate to steatosis. Portal vein is patent on color Doppler imaging with normal direction of blood flow towards the liver. Other: None. IMPRESSION: 1. Previous cholecystectomy. Dilated biliary ductal tree with the common duct measuring 13.8 mm. Intrahepatic ductal dilatation present as well. Similar to prior recent examinations. This ductal dilatation could be due to postsurgical dilatation in an elderly patient, but the possibility of Kemiyah Tarazon distal common duct obstruction is not excluded. 2. Heterogeneous echotexture of the liver probably related to steatosis. Electronically Signed   By: Oneil Officer M.D.   On: 01/07/2024 16:47        Scheduled Meds:  fluticasone  furoate-vilanterol  1 puff Inhalation Daily   morphine   30 mg Oral Q12H   nicotine   21 mg Transdermal Daily   pantoprazole   40 mg Oral Daily   sodium chloride  flush  3 mL Intravenous Q12H   Continuous Infusions:  piperacillin -tazobactam (ZOSYN )  IV 3.375 g (01/09/24 0555)   vancomycin  750 mg (01/08/24 2217)     LOS: 1 day    Time spent: over 30 min     Meliton Monte, MD [facility]  To contact the attending provider between 7A-7P or the covering provider during after hours 7P-7A, please log into the web site [email or web address] and access using universal [place] password for that web site. If you do not have the password, please call the hospital operator.  01/09/2024, 11:00 AM

## 2024-01-10 ENCOUNTER — Inpatient Hospital Stay (HOSPITAL_COMMUNITY)

## 2024-01-10 ENCOUNTER — Inpatient Hospital Stay (HOSPITAL_COMMUNITY): Admitting: Anesthesiology

## 2024-01-10 ENCOUNTER — Encounter (HOSPITAL_COMMUNITY)
Admission: EM | Disposition: A | Discharge status: Home / Self Care | Payer: Self-pay | Source: Home / Self Care | Attending: Family Medicine

## 2024-01-10 DIAGNOSIS — A499 Bacterial infection, unspecified: Secondary | ICD-10-CM

## 2024-01-10 DIAGNOSIS — B9620 Unspecified Escherichia coli [E. coli] as the cause of diseases classified elsewhere: Secondary | ICD-10-CM | POA: Diagnosis not present

## 2024-01-10 DIAGNOSIS — B952 Enterococcus as the cause of diseases classified elsewhere: Secondary | ICD-10-CM | POA: Diagnosis not present

## 2024-01-10 DIAGNOSIS — R7881 Bacteremia: Secondary | ICD-10-CM | POA: Diagnosis not present

## 2024-01-10 HISTORY — PX: TRANSESOPHAGEAL ECHOCARDIOGRAM (CATH LAB): EP1270

## 2024-01-10 LAB — COMPREHENSIVE METABOLIC PANEL WITH GFR
ALT: 27 U/L (ref 0–44)
AST: 21 U/L (ref 15–41)
Albumin: 2.5 g/dL — ABNORMAL LOW (ref 3.5–5.0)
Alkaline Phosphatase: 108 U/L (ref 38–126)
Anion gap: 10 (ref 5–15)
BUN: 15 mg/dL (ref 8–23)
CO2: 24 mmol/L (ref 22–32)
Calcium: 7.8 mg/dL — ABNORMAL LOW (ref 8.9–10.3)
Chloride: 104 mmol/L (ref 98–111)
Creatinine, Ser: 0.86 mg/dL (ref 0.44–1.00)
GFR, Estimated: >60 mL/min (ref >60)
Glucose, Bld: 103 mg/dL — ABNORMAL HIGH (ref 70–99)
Potassium: 4.2 mmol/L (ref 3.5–5.1)
Sodium: 138 mmol/L (ref 135–145)
Total Bilirubin: 1.9 mg/dL — ABNORMAL HIGH (ref 0.0–1.2)
Total Protein: 5.5 g/dL — ABNORMAL LOW (ref 6.5–8.1)

## 2024-01-10 LAB — CBC WITH DIFFERENTIAL/PLATELET
Abs Immature Granulocytes: 0.33 K/uL — ABNORMAL HIGH (ref 0.00–0.07)
Basophils Absolute: 0 K/uL (ref 0.0–0.1)
Basophils Relative: 0 %
Eosinophils Absolute: 0 K/uL (ref 0.0–0.5)
Eosinophils Relative: 0 %
HCT: 37 % (ref 36.0–46.0)
Hemoglobin: 12.5 g/dL (ref 12.0–15.0)
Immature Granulocytes: 2 %
Lymphocytes Relative: 12 %
Lymphs Abs: 1.7 K/uL (ref 0.7–4.0)
MCH: 28.7 pg (ref 26.0–34.0)
MCHC: 33.8 g/dL (ref 30.0–36.0)
MCV: 84.9 fL (ref 80.0–100.0)
Monocytes Absolute: 0.5 K/uL (ref 0.1–1.0)
Monocytes Relative: 4 %
Neutro Abs: 11 K/uL — ABNORMAL HIGH (ref 1.7–7.7)
Neutrophils Relative %: 82 %
Platelets: 169 K/uL (ref 150–400)
RBC: 4.36 MIL/uL (ref 3.87–5.11)
RDW: 14.2 % (ref 11.5–15.5)
WBC: 13.6 K/uL — ABNORMAL HIGH (ref 4.0–10.5)
nRBC: 0 % (ref 0.0–0.2)

## 2024-01-10 LAB — ECHO TEE

## 2024-01-10 LAB — HEMOGLOBIN A1C
Hgb A1c MFr Bld: 5.9 % — ABNORMAL HIGH (ref 4.8–5.6)
Mean Plasma Glucose: 123 mg/dL

## 2024-01-10 LAB — MAGNESIUM: Magnesium: 2.3 mg/dL (ref 1.7–2.4)

## 2024-01-10 LAB — PHOSPHORUS: Phosphorus: 1.6 mg/dL — ABNORMAL LOW (ref 2.5–4.6)

## 2024-01-10 SURGERY — TRANSESOPHAGEAL ECHOCARDIOGRAM (TEE) (CATHLAB)
Anesthesia: Monitor Anesthesia Care

## 2024-01-10 MED ORDER — POTASSIUM PHOSPHATES 15 MMOLE/5ML IV SOLN
20.0000 mmol | Freq: Once | INTRAVENOUS | Status: AC
  Administered 2024-01-10: 20 mmol via INTRAVENOUS
  Filled 2024-01-10: qty 6.67

## 2024-01-10 MED ORDER — SODIUM CHLORIDE 0.9 % IV SOLN: INTRAVENOUS | Status: DC

## 2024-01-10 MED ORDER — SODIUM CHLORIDE 0.9 % IV SOLN
2.0000 g | INTRAVENOUS | Status: DC
  Administered 2024-01-10: 2 g via INTRAVENOUS
  Filled 2024-01-10: qty 20

## 2024-01-10 MED ORDER — LIDOCAINE 2% (20 MG/ML) 5 ML SYRINGE
INTRAMUSCULAR | Status: DC | PRN
  Administered 2024-01-10: 100 mg via INTRAVENOUS

## 2024-01-10 MED ORDER — LACTATED RINGERS IV SOLN: INTRAVENOUS | Status: DC | PRN

## 2024-01-10 MED ORDER — PROPOFOL 10 MG/ML IV BOLUS
INTRAVENOUS | Status: DC | PRN
  Administered 2024-01-10: 40 mg via INTRAVENOUS
  Administered 2024-01-10: 30 mg via INTRAVENOUS

## 2024-01-10 MED ORDER — ONDANSETRON HCL 4 MG/2ML IJ SOLN
INTRAMUSCULAR | Status: DC | PRN
  Administered 2024-01-10: 4 mg via INTRAVENOUS

## 2024-01-10 MED ORDER — PROPOFOL 500 MG/50ML IV EMUL
INTRAVENOUS | Status: DC | PRN
  Administered 2024-01-10: 150 ug/kg/min via INTRAVENOUS

## 2024-01-10 MED ORDER — DEXAMETHASONE SOD PHOSPHATE PF 10 MG/ML IJ SOLN
INTRAMUSCULAR | Status: DC | PRN
  Administered 2024-01-10: 5 mg via INTRAVENOUS

## 2024-01-10 NOTE — Anesthesia Preprocedure Evaluation (Signed)
 Anesthesia Evaluation  Patient identified by MRN, date of birth, ID band Patient awake    Reviewed: Allergy & Precautions, NPO status , Patient's Chart, lab work & pertinent test results, reviewed documented beta blocker date and time   History of Anesthesia Complications Negative for: history of anesthetic complications  Airway Mallampati: II  TM Distance: >3 FB     Dental no notable dental hx.    Pulmonary asthma , neg sleep apnea, pneumonia, Current Smoker   breath sounds clear to auscultation       Cardiovascular  Rhythm:Regular Rate:Normal  01/2024: IMPRESSIONS     1. Left ventricular ejection fraction, by estimation, is 60 to 65%. The  left ventricle has normal function. The left ventricle has no regional  wall motion abnormalities. Left ventricular diastolic parameters were  normal.   2. Right ventricular systolic function is normal. The right ventricular  size is normal. Tricuspid regurgitation signal is inadequate for assessing  PA pressure.   3. The mitral valve is normal in structure. Mild mitral valve  regurgitation. No evidence of mitral stenosis.   4. 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 In the clinical setting of bacterimia  recommend TEE for clarity. The aortic valve is normal in structure. Aortic  valve regurgitation is not  visualized. Aortic valve sclerosis/calcification is present, without any  evidence of aortic stenosis.   5. The inferior vena cava is dilated in size with <50% respiratory  variability, suggesting right atrial pressure of 15 mmHg.     Neuro/Psych neg Seizures    GI/Hepatic PUD,,,(+)     substance abuse  cholangitis   Endo/Other  Hypothyroidism    Renal/GU      Musculoskeletal  (+) Arthritis ,  narcotic dependent  Abdominal   Peds  Hematology   Anesthesia Other Findings Bacteremia   Reproductive/Obstetrics                              Anesthesia Physical Anesthesia Plan  ASA: 3  Anesthesia Plan: MAC   Post-op Pain Management:    Induction: Intravenous  PONV Risk Score and Plan: 2 and Ondansetron, Dexamethasone and Treatment may vary due to age or medical condition  Airway Management Planned: Natural Airway and Simple Face Mask  Additional Equipment:   Intra-op Plan:   Post-operative Plan:   Informed Consent: I have reviewed the patients History and Physical, chart, labs and discussed the procedure including the risks, benefits and alternatives for the proposed anesthesia with the patient or authorized representative who has indicated his/her understanding and acceptance.     Dental advisory given  Plan Discussed with: CRNA  Anesthesia Plan Comments: (73 yo with hx cholecystectomy, asthma, tobacco abuse, hypothyroidism, chronic back pain who presented with altered mental status and was found to have sepsis in the setting of cholangitis and pneumonia.     Now s/p ERCP, cholodocholithiasis noted.  Removal by biliary sphincterotomy and balloon sphincteroplasty.  Biliary stent placed to ensure patency.     ID following now for polymicrobial bacteremia, echo and repeat blood cultures pending.     )         Anesthesia Quick Evaluation

## 2024-01-10 NOTE — Progress Notes (Signed)
 [facility]  Date of Admission:  01/07/2024      Total days of antibiotics 2   Vancomycin   Zosyn          ASSESSMENT: Polymicrobial Bacteremia -  Enterococcus Faecium, E Coli -  Encephalopathy (resolved) -  BCx + 11/30, negative prelim 12/01.  Bacteremia in the setting of cholangitis - MRI of abdomen that shows multiple stones filling the mid and distal common bile duct measuring up to 8 mm. ERCP results noted sphincterotomy, and extraction of multiple common bile duct stones (14), there was active pus draining from the duct and this did not stop even after stone extraction so 10 French/5 cm plastic biliary stent was placed.  Ruled out endocarditis.  Waiting on susceptibilities for enterococcus faecium (hopefully amp sensitive...) Narrow to Ceftriaxone for E coli.  Suspect she will needs 2 weeks of antibiotics following her prompt source control. She does have a stent in place that will likely be there for 2 months - will need to consider suppression until removal given the amount of purulence described to the area I worry that may risk recurrent illness for her.   Abnormal TTE -  Recommended to pursue TEE with enterococcus in mix - aortic valve with some calcification, valve with intergrity doubt this is a vegetation. Will not need prolonged / combination treatment.   Abnormal Chest XRAY -  Indicates RLL opacity - ?if related to atelectasis from pain vs aspiration PNA with h/o vomiting and AMS. No supplemental O2. Well covered if this is infectious.    Chronic back pain -  At first was saying this was severely increased, however felt as if it was having to stop maintenance opioids and acute flare from that. She feels like this is more at baseline today but in general uncomfortable under right upper abdomen. Will follow up tomorrow.    Reactive Hepatitis C Antibody -  Awaiting RNA level to determine if needs treatment. Still pending today.     PLAN: Change zosyn to ceftriaxone  Continue vancomycin  Follow for pending micro  Final recommendations pending Will need to consider suppression until removal given the amount of purulence described to the area I worry that may risk recurrent illness for her.   Principal Problem:   Polymicrobial bacteremia Active Problems:   Cholangitis (HCC)   Abnormal LFTs   Choledocholithiasis   Bacteremia due to Gram-negative bacteria   Abdominal pain, RUQ   Erosive gastritis   Sepsis (HCC)   Community acquired pneumonia of right lower lobe of lung    [MAR Hold] fluticasone  furoate-vilanterol  1 puff Inhalation Daily   [MAR Hold] morphine  30 mg Oral Q12H   [MAR Hold] nicotine  21 mg Transdermal Daily   [MAR Hold] pantoprazole  40 mg Oral Daily   [MAR Hold] polyethylene glycol  17 g Oral Daily   [MAR Hold] progesterone  100 mg Oral QHS   [MAR Hold] senna-docusate  2 tablet Oral QHS   [MAR Hold] sodium chloride flush  3 mL Intravenous Q12H   [MAR Hold] thyroid   75 mg Oral QAC breakfast    SUBJECTIVE: Feeling better   Review of Systems: ROS  Allergies  Allergen Reactions   Iodinated Contrast Media Shortness Of Breath and Rash   Ery-Tab [Erythromycin] Nausea And Vomiting   Statins Other (See Comments)    Myalgias  Fatigue     OBJECTIVE: Vitals:   01/10/24 1046 01/10/24 1145  01/10/24 1155 01/10/24 1200  BP: (!) 159/93 136/85 (!) 152/93 (!) 147/84  Pulse: 65 (!) 59 61 67  Resp: 15 16 16 19   Temp:  98.3 F (36.8 C)    TempSrc:  Temporal    SpO2: 95% 93% 96% 97%  Weight:      Height:       Body mass index is 22.69 kg/m.  Physical Exam  Lab Results Lab Results  Component Value Date   WBC 13.6 (H) 01/10/2024   HGB 12.5 01/10/2024   HCT 37.0 01/10/2024   MCV 84.9 01/10/2024   PLT 169 01/10/2024    Lab Results  Component Value Date   CREATININE 0.86 01/10/2024   BUN 15 01/10/2024   NA 138 01/10/2024   K 4.2 01/10/2024   CL 104 01/10/2024   CO2 24  01/10/2024    Lab Results  Component Value Date   ALT 27 01/10/2024   AST 21 01/10/2024   ALKPHOS 108 01/10/2024   BILITOT 1.9 (H) 01/10/2024     Microbiology: Recent Results (from the past 240 hours)  Resp panel by RT-PCR (RSV, Flu A&B, Covid) Anterior Nasal Swab     Status: None   Collection Time: 01/07/24  9:47 AM   Specimen: Anterior Nasal Swab  Result Value Ref Range Status   SARS Coronavirus 2 by RT PCR NEGATIVE NEGATIVE Final   Influenza A by PCR NEGATIVE NEGATIVE Final   Influenza B by PCR NEGATIVE NEGATIVE Final    Comment: (NOTE) The Xpert Xpress SARS-CoV-2/FLU/RSV plus assay is intended as an aid in the diagnosis of influenza from Nasopharyngeal swab specimens and should not be used as a sole basis for treatment. Nasal washings and aspirates are unacceptable for Xpert Xpress SARS-CoV-2/FLU/RSV testing.  Fact Sheet for Patients: bloggercourse.com  Fact Sheet for Healthcare Providers: seriousbroker.it  This test is not yet approved or cleared by the [place]  FDA and has been authorized for detection and/or diagnosis of SARS-CoV-2 by FDA under an Emergency Use Authorization (EUA). This EUA will remain in effect (meaning this test can be used) for the duration of the COVID-19 declaration under Section 564(b)(1) of the Act, 21 U.S.C. section 360bbb-3(b)(1), unless the authorization is terminated or revoked.     Resp Syncytial Virus by PCR NEGATIVE NEGATIVE Final    Comment: (NOTE) Fact Sheet for Patients: bloggercourse.com  Fact Sheet for Healthcare Providers: seriousbroker.it  This test is not yet approved or cleared by the [place]  FDA and has been authorized for detection and/or diagnosis of SARS-CoV-2 by FDA under an Emergency Use Authorization (EUA). This EUA will remain in effect (meaning this test can be used) for the duration of the COVID-19  declaration under Section 564(b)(1) of the Act, 21 U.S.C. section 360bbb-3(b)(1), unless the authorization is terminated or revoked.  Performed at [facility], [address] [place], [place] [address]   Blood Culture (routine x 2)     Status: Abnormal (Preliminary result)   Collection Time: 01/07/24  9:47 AM   Specimen: BLOOD LEFT ARM  Result Value Ref Range Status   Specimen Description BLOOD LEFT ARM  Final   Special Requests   Final    BOTTLES DRAWN AEROBIC AND ANAEROBIC Blood Culture adequate volume   Culture  Setup Time   Final    GRAM NEGATIVE RODS GRAM POSITIVE COCCI IN PAIRS IN CHAINS IN BOTH AEROBIC AND ANAEROBIC BOTTLES CRITICAL RESULT CALLED TO, READ BACK BY AND VERIFIED WITH: PHARMD J LEDFORD 01/08/2024 @  0229 BY AB    Culture (A)  Final    ESCHERICHIA COLI ENTEROCOCCUS FAECIUM SUSCEPTIBILITIES TO FOLLOW    Report Status PENDING  Incomplete   Organism ID, Bacteria ESCHERICHIA COLI  Final      Susceptibility   Escherichia coli - MIC*    AMPICILLIN >=32 RESISTANT Resistant     CEFAZOLIN (NON-URINE) 4 INTERMEDIATE Intermediate     CEFEPIME <=0.12 SENSITIVE Sensitive     ERTAPENEM <=0.12 SENSITIVE Sensitive     CEFTRIAXONE <=0.25 SENSITIVE Sensitive     CIPROFLOXACIN <=0.06 SENSITIVE Sensitive     GENTAMICIN <=1 SENSITIVE Sensitive     MEROPENEM <=0.25 SENSITIVE Sensitive     TRIMETH/SULFA >=320 RESISTANT Resistant     AMPICILLIN/SULBACTAM 8 SENSITIVE Sensitive     PIP/TAZO Value in next row Sensitive      <=4 SENSITIVEThis is a modified FDA-approved test that has been validated and its performance characteristics determined by the reporting laboratory.  This laboratory is certified under the Clinical Laboratory Improvement Amendments CLIA as qualified to perform high complexity clinical laboratory testing.Performed at [facility], [address] [place], [place] [address]    * ESCHERICHIA COLI  Blood Culture ID Panel (Reflexed)     Status: Abnormal    Collection Time: 01/07/24  9:47 AM  Result Value Ref Range Status   Enterococcus faecalis NOT DETECTED NOT DETECTED Final   Enterococcus Faecium DETECTED (A) NOT DETECTED Final    Comment: CRITICAL RESULT CALLED TO, READ BACK BY AND VERIFIED WITH: PHARMD J LEDFORD 01/08/2024 @ 0229 BY AB    Listeria monocytogenes NOT DETECTED NOT DETECTED Final   Staphylococcus species NOT DETECTED NOT DETECTED Final   Staphylococcus aureus (BCID) NOT DETECTED NOT DETECTED Final   Staphylococcus epidermidis NOT DETECTED NOT DETECTED Final   Staphylococcus lugdunensis NOT DETECTED NOT DETECTED Final   Streptococcus species NOT DETECTED NOT DETECTED Final   Streptococcus agalactiae NOT DETECTED NOT DETECTED Final   Streptococcus pneumoniae NOT DETECTED NOT DETECTED Final   Streptococcus pyogenes NOT DETECTED NOT DETECTED Final   A.calcoaceticus-baumannii NOT DETECTED NOT DETECTED Final   Bacteroides fragilis NOT DETECTED NOT DETECTED Final   Enterobacterales DETECTED (A) NOT DETECTED Final    Comment: Enterobacterales represent a large order of gram negative bacteria, not a single organism. CRITICAL RESULT CALLED TO, READ BACK BY AND VERIFIED WITH: PHARMD J LEDFORD 01/08/2024 @ 0229 BY AB    Enterobacter cloacae complex NOT DETECTED NOT DETECTED Final   Escherichia coli DETECTED (A) NOT DETECTED Final    Comment: CRITICAL RESULT CALLED TO, READ BACK BY AND VERIFIED WITH: PHARMD J LEDFORD 01/08/2024 @ 0229 BY AB    Klebsiella aerogenes NOT DETECTED NOT DETECTED Final   Klebsiella oxytoca NOT DETECTED NOT DETECTED Final   Klebsiella pneumoniae NOT DETECTED NOT DETECTED Final   Proteus species NOT DETECTED NOT DETECTED Final   Salmonella species NOT DETECTED NOT DETECTED Final   Serratia marcescens NOT DETECTED NOT DETECTED Final   Haemophilus influenzae NOT DETECTED NOT DETECTED Final   Neisseria meningitidis NOT DETECTED NOT DETECTED Final   Pseudomonas aeruginosa NOT DETECTED NOT DETECTED Final    Stenotrophomonas maltophilia NOT DETECTED NOT DETECTED Final   Candida albicans NOT DETECTED NOT DETECTED Final   Candida auris NOT DETECTED NOT DETECTED Final   Candida glabrata NOT DETECTED NOT DETECTED Final   Candida krusei NOT DETECTED NOT DETECTED Final   Candida parapsilosis NOT DETECTED NOT DETECTED Final   Candida tropicalis NOT DETECTED NOT DETECTED  Final   Cryptococcus neoformans/gattii NOT DETECTED NOT DETECTED Final   CTX-M ESBL NOT DETECTED NOT DETECTED Final   Carbapenem resistance IMP NOT DETECTED NOT DETECTED Final   Carbapenem resistance KPC NOT DETECTED NOT DETECTED Final   Carbapenem resistance NDM NOT DETECTED NOT DETECTED Final   Carbapenem resist OXA 48 LIKE NOT DETECTED NOT DETECTED Final   Vancomycin resistance NOT DETECTED NOT DETECTED Final   Carbapenem resistance VIM NOT DETECTED NOT DETECTED Final    Comment: Performed at [facility], [address], [place], [place] [address]  Blood Culture (routine x 2)     Status: Abnormal (Preliminary result)   Collection Time: 01/07/24  9:52 AM   Specimen: BLOOD RIGHT ARM  Result Value Ref Range Status   Specimen Description BLOOD RIGHT ARM  Final   Special Requests   Final    BOTTLES DRAWN AEROBIC AND ANAEROBIC Blood Culture adequate volume   Culture  Setup Time   Final    GRAM NEGATIVE RODS IN BOTH AEROBIC AND ANAEROBIC BOTTLES GRAM POSITIVE COCCI IN PAIRS IN CHAINS AEROBIC BOTTLE ONLY Performed at [facility], [address], [place], [place] [address]    Culture ESCHERICHIA COLI ENTEROCOCCUS FAECIUM  (A)  Final   Report Status PENDING  Incomplete  Respiratory (~20 pathogens) panel by PCR     Status: None   Collection Time: 01/07/24  8:46 PM  Result Value Ref Range Status   Adenovirus NOT DETECTED NOT DETECTED Final   Coronavirus 229E NOT DETECTED NOT DETECTED Final    Comment: (NOTE) The Coronavirus on the Respiratory Panel, DOES NOT test for the novel  Coronavirus (2019 nCoV)     Coronavirus HKU1 NOT DETECTED NOT DETECTED Final   Coronavirus NL63 NOT DETECTED NOT DETECTED Final   Coronavirus OC43 NOT DETECTED NOT DETECTED Final   Metapneumovirus NOT DETECTED NOT DETECTED Final   Rhinovirus / Enterovirus NOT DETECTED NOT DETECTED Final   Influenza A NOT DETECTED NOT DETECTED Final   Influenza B NOT DETECTED NOT DETECTED Final   Parainfluenza Virus 1 NOT DETECTED NOT DETECTED Final   Parainfluenza Virus 2 NOT DETECTED NOT DETECTED Final   Parainfluenza Virus 3 NOT DETECTED NOT DETECTED Final   Parainfluenza Virus 4 NOT DETECTED NOT DETECTED Final   Respiratory Syncytial Virus NOT DETECTED NOT DETECTED Final   Bordetella pertussis NOT DETECTED NOT DETECTED Final   Bordetella Parapertussis NOT DETECTED NOT DETECTED Final   Chlamydophila pneumoniae NOT DETECTED NOT DETECTED Final   Mycoplasma pneumoniae NOT DETECTED NOT DETECTED Final    Comment: Performed at [facility], [address], [place], [place] [address]  Culture, blood (Routine X 2) w Reflex to ID Panel     Status: None (Preliminary result)   Collection Time: 01/08/24  7:25 PM   Specimen: BLOOD  Result Value Ref Range Status   Specimen Description BLOOD SITE NOT SPECIFIED  Final   Special Requests   Final    BOTTLES DRAWN AEROBIC AND ANAEROBIC Blood Culture results may not be optimal due to an inadequate volume of blood received in culture bottles   Culture   Final    NO GROWTH 2 DAYS Performed at [facility], [address] [place], [place] [address]    Report Status PENDING  Incomplete  Culture, blood (Routine X 2) w Reflex to ID Panel     Status: None (Preliminary result)   Collection Time: 01/08/24  7:25 PM   Specimen: BLOOD  Result Value Ref Range Status   Specimen Description BLOOD SITE NOT SPECIFIED  Final   Special Requests   Final    BOTTLES DRAWN AEROBIC AND ANAEROBIC Blood Culture results may not be optimal due to an inadequate volume of blood received in culture bottles    Culture   Final    NO GROWTH 2 DAYS Performed at [facility], [address], [place], [place] [address]    Report Status PENDING  Incomplete     Corean Fireman, MSN, NP-C [facility]  [email or web address] Pager: [phone] Office: [phone] [facility] Main Line: [phone] *Secure Chat Communication Welcome

## 2024-01-10 NOTE — Interval H&P Note (Signed)
 History and Physical Interval Note:  01/10/2024 11:05 AM  Molly Myers  has presented today for surgery, with the diagnosis of bacteremia.  The various methods of treatment have been discussed with the patient and family. After consideration of risks, benefits and other options for treatment, the patient has consented to  Procedure(s): TRANSESOPHAGEAL ECHOCARDIOGRAM (N/A) as a surgical intervention.  The patient's history has been reviewed, patient examined, no change in status, stable for surgery.  I have reviewed the patient's chart and labs.  Questions were answered to the patient's satisfaction.     Molly Myers

## 2024-01-10 NOTE — Progress Notes (Signed)
 PROGRESS NOTE    Chrislynn Mosely  FMW:[id number] DOB: 09/14/1950 DOA: 01/07/2024 PCP: Clarice Nottingham, MD    Brief Narrative:  73 year old with history of cholecystectomy almost 30 years ago, hypothyroidism, chronic back pain presented with confusion, fever, shortness of breath.  She was found to have cholecystitis and pneumonia as well as bacteremia. Underwent ERCP, biliary sphincterotomy sphincteroplasty.  Purulent cholangitis noted. Followed by ID, GI.  Subjective: Patient seen in the morning rounds before going for procedure.  Her husband was at the bedside.  Patient was anxious about the procedure and she was worried about what we will find inside.  She wants to go home. Denies any active issues today.  Denies any chest pain shortness of breath or abdominal pain.  Assessment & Plan:   Sepsis secondary to cholangitis, pneumonia, polymicrobial bacteremia. Choledocholithiasis status postcholecystectomy  Presented with fever, leukocytosis with evidence of cholangitis. MRCP with choledocholithiasis and dilated common bile duct 1.6 cm ERCP with choledocholithiasis, complete removal by biliary sphincterotomy and balloon sphincteroplasty-biliary stent placed, purulent drainage-stent removal in 2 months that GI will schedule LFTs improving. Blood cultures 11/30 E. coli, Enterococcus faecium Blood cultures 12/1, negative so far TTE 12/2, suspected aortic calcification versus vegetation TEE 12/3, no evidence of aortic vegetation. Clinically improving.  ID following.  ID to recommend course of antibiotics.  Currently remains on vancomycin and Zosyn.  Hypophosphatemia: Replaced.   Acute metabolic infective encephalopathy: Improving. Asthma: Stable. Hypothyroidism: On Synthroid Chronic pain syndrome: On morphine at home.     DVT prophylaxis: Place and maintain sequential compression device Start: 01/08/24 1154   Code Status: Full code Family Communication: Husband at the  bedside Disposition Plan: Status is: Inpatient Remains inpatient appropriate because: IV antibiotics, inpatient procedures     Consultants:  Gastroenterology ID  Procedures:  ERCP TEE  Antimicrobials:  Vancomycin and Zosyn---     Objective: Vitals:   01/10/24 1210 01/10/24 1215 01/10/24 1220 01/10/24 1240  BP: (!) 158/93 (!) 158/92 (!) 156/98 (!) 160/86  Pulse: 60 (!) 59 63 65  Resp: 17 12 20 18   Temp:    97.6 F (36.4 C)  TempSrc:    Oral  SpO2: 99% 99% 98% 98%  Weight:      Height:        Intake/Output Summary (Last 24 hours) at 01/10/2024 1316 Last data filed at 01/10/2024 1130 Gross per 24 hour  Intake 558 ml  Output --  Net 558 ml   Filed Weights   01/07/24 0936 01/08/24 1250  Weight: 58.1 kg 58.1 kg    Examination:  General exam: Appears calm and comfortable.  On room air.  Slightly anxious. Respiratory system: Clear to auscultation. Respiratory effort normal. Cardiovascular system: S1 & S2 heard, RRR. No JVD, murmurs, rubs, gallops or clicks. No pedal edema. Gastrointestinal system: Abdomen is nondistended, soft and nontender. No organomegaly or masses felt. Normal bowel sounds heard. Central nervous system: Alert and oriented. No focal neurological deficits.    Data Reviewed: I have personally reviewed following labs and imaging studies  CBC: Recent Labs  Lab 01/07/24 1020 01/07/24 1030 01/07/24 1031 01/08/24 0632 01/09/24 0437 01/10/24 0418  WBC 13.9*  --   --  16.0* 9.8 13.6*  NEUTROABS 12.8*  --   --   --  8.0* 11.0*  HGB 15.6* 15.6* 16.0* 13.6 13.0 12.5  HCT 46.0 46.0 47.0* 40.3 38.2 37.0  MCV 84.4  --   --  86.1 84.3 84.9  PLT 184  --   --  124* 117* 169   Basic Metabolic Panel: Recent Labs  Lab 01/07/24 1020 01/07/24 1030 01/07/24 1031 01/08/24 0632 01/09/24 0437 01/10/24 0418  NA 138 138 136 138 136 138  K 3.1* 3.1* 3.1* 4.6 4.4 4.2  CL 101 98  --  106 108 104  CO2 22  --   --  23 21* 24  GLUCOSE 90 96  --  75 146*  103*  BUN 12 13  --  20 21 15   CREATININE 0.97 1.00  --  1.07* 0.92 0.86  CALCIUM 9.4  --   --  7.8* 7.8* 7.8*  MG  --   --   --   --  2.1 2.3  PHOS  --   --   --   --  2.3* 1.6*   GFR: Estimated Creatinine Clearance: 47.5 mL/min (by C-G formula based on SCr of 0.86 mg/dL). Liver Function Tests: Recent Labs  Lab 01/07/24 1020 01/08/24 0632 01/09/24 0437 01/10/24 0418  AST 135* 77* 39 21  ALT 77* 53* 39 27  ALKPHOS 137* 98 110 108  BILITOT 7.5* 6.8* 3.1* 1.9*  PROT 6.3* 5.3* 5.7* 5.5*  ALBUMIN 3.4* 2.6* 2.6* 2.5*   Recent Labs  Lab 01/08/24 1015  LIPASE 20   Recent Labs  Lab 01/07/24 1020  AMMONIA 39*   Coagulation Profile: No results for input(s): INR, PROTIME in the last 168 hours. Cardiac Enzymes: No results for input(s): CKTOTAL, CKMB, CKMBINDEX, TROPONINI in the last 168 hours. BNP (last 3 results) No results for input(s): PROBNP in the last 8760 hours. HbA1C: Recent Labs    01/09/24 0437  HGBA1C 5.9*   CBG: No results for input(s): GLUCAP in the last 168 hours. Lipid Profile: No results for input(s): CHOL, HDL, LDLCALC, TRIG, CHOLHDL, LDLDIRECT in the last 72 hours. Thyroid  Function Tests: No results for input(s): TSH, T4TOTAL, FREET4, T3FREE, THYROIDAB in the last 72 hours. Anemia Panel: No results for input(s): VITAMINB12, FOLATE, FERRITIN, TIBC, IRON, RETICCTPCT in the last 72 hours. Sepsis Labs: Recent Labs  Lab 01/07/24 1020 01/07/24 1031 01/07/24 1800  PROCALCITON 4.07  --   --   LATICACIDVEN  --  2.7* 2.4*    Recent Results (from the past 240 hours)  Resp panel by RT-PCR (RSV, Flu A&B, Covid) Anterior Nasal Swab     Status: None   Collection Time: 01/07/24  9:47 AM   Specimen: Anterior Nasal Swab  Result Value Ref Range Status   SARS Coronavirus 2 by RT PCR NEGATIVE NEGATIVE Final   Influenza A by PCR NEGATIVE NEGATIVE Final   Influenza B by PCR NEGATIVE NEGATIVE Final    Comment:  (NOTE) The Xpert Xpress SARS-CoV-2/FLU/RSV plus assay is intended as an aid in the diagnosis of influenza from Nasopharyngeal swab specimens and should not be used as a sole basis for treatment. Nasal washings and aspirates are unacceptable for Xpert Xpress SARS-CoV-2/FLU/RSV testing.  Fact Sheet for Patients: bloggercourse.com  Fact Sheet for Healthcare Providers: seriousbroker.it  This test is not yet approved or cleared by the [place]  FDA and has been authorized for detection and/or diagnosis of SARS-CoV-2 by FDA under an Emergency Use Authorization (EUA). This EUA will remain in effect (meaning this test can be used) for the duration of the COVID-19 declaration under Section 564(b)(1) of the Act, 21 U.S.C. section 360bbb-3(b)(1), unless the authorization is terminated or revoked.     Resp Syncytial Virus by PCR NEGATIVE NEGATIVE Final    Comment: (NOTE) Fact Sheet for  Patients: bloggercourse.com  Fact Sheet for Healthcare Providers: seriousbroker.it  This test is not yet approved or cleared by the [place]  FDA and has been authorized for detection and/or diagnosis of SARS-CoV-2 by FDA under an Emergency Use Authorization (EUA). This EUA will remain in effect (meaning this test can be used) for the duration of the COVID-19 declaration under Section 564(b)(1) of the Act, 21 U.S.C. section 360bbb-3(b)(1), unless the authorization is terminated or revoked.  Performed at [facility], [address] [place], [place] [address]   Blood Culture (routine x 2)     Status: Abnormal (Preliminary result)   Collection Time: 01/07/24  9:47 AM   Specimen: BLOOD LEFT ARM  Result Value Ref Range Status   Specimen Description BLOOD LEFT ARM  Final   Special Requests   Final    BOTTLES DRAWN AEROBIC AND ANAEROBIC Blood Culture adequate volume   Culture  Setup Time    Final    GRAM NEGATIVE RODS GRAM POSITIVE COCCI IN PAIRS IN CHAINS IN BOTH AEROBIC AND ANAEROBIC BOTTLES CRITICAL RESULT CALLED TO, READ BACK BY AND VERIFIED WITH: PHARMD J LEDFORD 01/08/2024 @ 0229 BY AB    Culture (A)  Final    ESCHERICHIA COLI ENTEROCOCCUS FAECIUM SUSCEPTIBILITIES TO FOLLOW    Report Status PENDING  Incomplete   Organism ID, Bacteria ESCHERICHIA COLI  Final      Susceptibility   Escherichia coli - MIC*    AMPICILLIN >=32 RESISTANT Resistant     CEFAZOLIN (NON-URINE) 4 INTERMEDIATE Intermediate     CEFEPIME <=0.12 SENSITIVE Sensitive     ERTAPENEM <=0.12 SENSITIVE Sensitive     CEFTRIAXONE <=0.25 SENSITIVE Sensitive     CIPROFLOXACIN <=0.06 SENSITIVE Sensitive     GENTAMICIN <=1 SENSITIVE Sensitive     MEROPENEM <=0.25 SENSITIVE Sensitive     TRIMETH/SULFA >=320 RESISTANT Resistant     AMPICILLIN/SULBACTAM 8 SENSITIVE Sensitive     PIP/TAZO Value in next row Sensitive      <=4 SENSITIVEThis is a modified FDA-approved test that has been validated and its performance characteristics determined by the reporting laboratory.  This laboratory is certified under the Clinical Laboratory Improvement Amendments CLIA as qualified to perform high complexity clinical laboratory testing.Performed at [facility], [address], [place], [place] [address]    * ESCHERICHIA COLI  Blood Culture ID Panel (Reflexed)     Status: Abnormal   Collection Time: 01/07/24  9:47 AM  Result Value Ref Range Status   Enterococcus faecalis NOT DETECTED NOT DETECTED Final   Enterococcus Faecium DETECTED (A) NOT DETECTED Final    Comment: CRITICAL RESULT CALLED TO, READ BACK BY AND VERIFIED WITH: PHARMD J LEDFORD 01/08/2024 @ 0229 BY AB    Listeria monocytogenes NOT DETECTED NOT DETECTED Final   Staphylococcus species NOT DETECTED NOT DETECTED Final   Staphylococcus aureus (BCID) NOT DETECTED NOT DETECTED Final   Staphylococcus epidermidis NOT DETECTED NOT DETECTED Final    Staphylococcus lugdunensis NOT DETECTED NOT DETECTED Final   Streptococcus species NOT DETECTED NOT DETECTED Final   Streptococcus agalactiae NOT DETECTED NOT DETECTED Final   Streptococcus pneumoniae NOT DETECTED NOT DETECTED Final   Streptococcus pyogenes NOT DETECTED NOT DETECTED Final   A.calcoaceticus-baumannii NOT DETECTED NOT DETECTED Final   Bacteroides fragilis NOT DETECTED NOT DETECTED Final   Enterobacterales DETECTED (A) NOT DETECTED Final    Comment: Enterobacterales represent a large order of gram negative bacteria, not a single organism. CRITICAL RESULT CALLED TO, READ BACK BY  AND VERIFIED WITH: PHARMD J LEDFORD 01/08/2024 @ 0229 BY AB    Enterobacter cloacae complex NOT DETECTED NOT DETECTED Final   Escherichia coli DETECTED (A) NOT DETECTED Final    Comment: CRITICAL RESULT CALLED TO, READ BACK BY AND VERIFIED WITH: PHARMD J LEDFORD 01/08/2024 @ 0229 BY AB    Klebsiella aerogenes NOT DETECTED NOT DETECTED Final   Klebsiella oxytoca NOT DETECTED NOT DETECTED Final   Klebsiella pneumoniae NOT DETECTED NOT DETECTED Final   Proteus species NOT DETECTED NOT DETECTED Final   Salmonella species NOT DETECTED NOT DETECTED Final   Serratia marcescens NOT DETECTED NOT DETECTED Final   Haemophilus influenzae NOT DETECTED NOT DETECTED Final   Neisseria meningitidis NOT DETECTED NOT DETECTED Final   Pseudomonas aeruginosa NOT DETECTED NOT DETECTED Final   Stenotrophomonas maltophilia NOT DETECTED NOT DETECTED Final   Candida albicans NOT DETECTED NOT DETECTED Final   Candida auris NOT DETECTED NOT DETECTED Final   Candida glabrata NOT DETECTED NOT DETECTED Final   Candida krusei NOT DETECTED NOT DETECTED Final   Candida parapsilosis NOT DETECTED NOT DETECTED Final   Candida tropicalis NOT DETECTED NOT DETECTED Final   Cryptococcus neoformans/gattii NOT DETECTED NOT DETECTED Final   CTX-M ESBL NOT DETECTED NOT DETECTED Final   Carbapenem resistance IMP NOT DETECTED NOT DETECTED  Final   Carbapenem resistance KPC NOT DETECTED NOT DETECTED Final   Carbapenem resistance NDM NOT DETECTED NOT DETECTED Final   Carbapenem resist OXA 48 LIKE NOT DETECTED NOT DETECTED Final   Vancomycin resistance NOT DETECTED NOT DETECTED Final   Carbapenem resistance VIM NOT DETECTED NOT DETECTED Final    Comment: Performed at [facility], [address] [place], [place] [address]  Blood Culture (routine x 2)     Status: Abnormal (Preliminary result)   Collection Time: 01/07/24  9:52 AM   Specimen: BLOOD RIGHT ARM  Result Value Ref Range Status   Specimen Description BLOOD RIGHT ARM  Final   Special Requests   Final    BOTTLES DRAWN AEROBIC AND ANAEROBIC Blood Culture adequate volume   Culture  Setup Time   Final    GRAM NEGATIVE RODS IN BOTH AEROBIC AND ANAEROBIC BOTTLES GRAM POSITIVE COCCI IN PAIRS IN CHAINS AEROBIC BOTTLE ONLY Performed at [facility], [address], [place], [place] [address]    Culture ESCHERICHIA COLI ENTEROCOCCUS FAECIUM  (A)  Final   Report Status PENDING  Incomplete  Respiratory (~20 pathogens) panel by PCR     Status: None   Collection Time: 01/07/24  8:46 PM  Result Value Ref Range Status   Adenovirus NOT DETECTED NOT DETECTED Final   Coronavirus 229E NOT DETECTED NOT DETECTED Final    Comment: (NOTE) The Coronavirus on the Respiratory Panel, DOES NOT test for the novel  Coronavirus (2019 nCoV)    Coronavirus HKU1 NOT DETECTED NOT DETECTED Final   Coronavirus NL63 NOT DETECTED NOT DETECTED Final   Coronavirus OC43 NOT DETECTED NOT DETECTED Final   Metapneumovirus NOT DETECTED NOT DETECTED Final   Rhinovirus / Enterovirus NOT DETECTED NOT DETECTED Final   Influenza A NOT DETECTED NOT DETECTED Final   Influenza B NOT DETECTED NOT DETECTED Final   Parainfluenza Virus 1 NOT DETECTED NOT DETECTED Final   Parainfluenza Virus 2 NOT DETECTED NOT DETECTED Final   Parainfluenza Virus 3 NOT DETECTED NOT DETECTED Final   Parainfluenza  Virus 4 NOT DETECTED NOT DETECTED Final   Respiratory Syncytial Virus NOT DETECTED NOT DETECTED Final  Bordetella pertussis NOT DETECTED NOT DETECTED Final   Bordetella Parapertussis NOT DETECTED NOT DETECTED Final   Chlamydophila pneumoniae NOT DETECTED NOT DETECTED Final   Mycoplasma pneumoniae NOT DETECTED NOT DETECTED Final    Comment: Performed at [facility], [address], [place], [place] [address]  Culture, blood (Routine X 2) w Reflex to ID Panel     Status: None (Preliminary result)   Collection Time: 01/08/24  7:25 PM   Specimen: BLOOD  Result Value Ref Range Status   Specimen Description BLOOD SITE NOT SPECIFIED  Final   Special Requests   Final    BOTTLES DRAWN AEROBIC AND ANAEROBIC Blood Culture results may not be optimal due to an inadequate volume of blood received in culture bottles   Culture   Final    NO GROWTH 2 DAYS Performed at [facility], [address], [place], [place] [address]    Report Status PENDING  Incomplete  Culture, blood (Routine X 2) w Reflex to ID Panel     Status: None (Preliminary result)   Collection Time: 01/08/24  7:25 PM   Specimen: BLOOD  Result Value Ref Range Status   Specimen Description BLOOD SITE NOT SPECIFIED  Final   Special Requests   Final    BOTTLES DRAWN AEROBIC AND ANAEROBIC Blood Culture results may not be optimal due to an inadequate volume of blood received in culture bottles   Culture   Final    NO GROWTH 2 DAYS Performed at [facility], [address] [place], [place] [address]    Report Status PENDING  Incomplete         Radiology Studies: EP STUDY Result Date: 01/10/2024 See surgical note for result.  DG ERCP Result Date: 01/10/2024 CLINICAL DATA:  [id number] Surgery, elective [phone]. Choledocholithiasis. EXAM: ERCP COMPARISON:  MRCP, 01/07/2024.  US  Abdomen, 01/07/2024. FLUOROSCOPY: Exposure Index (as provided by the fluoroscopic device): 11.1 mGy Kerma FINDINGS: Limited oblique planar  images of the RIGHT upper quadrant obtained C-arm. Images demonstrating flexible endoscopy, biliary duct cannulation, sphincterotomy, retrograde cholangiogram and balloon sweep. Biliary ductal dilation, with evidence of biliary filling defects demonstrated. Plastic biliary stent placement IMPRESSION: Fluoroscopic imaging for ERCP, demonstrating choledocholithiasis and plastic biliary stent placement For complete description of intra procedural findings, please see performing service dictation. Electronically Signed   By: Thom Hall M.D.   On: 01/10/2024 08:56   ECHOCARDIOGRAM COMPLETE Result Date: 01/09/2024    ECHOCARDIOGRAM REPORT   Patient Name:   LUCETTA BAEHR Date of Exam: 01/09/2024 Medical Rec #:  [id number]      Height:       63.0 in Accession #:    [id number]     Weight:       128.1 lb Date of Birth:  05-01-1950       BSA:          1.600 m Patient Age:    73 years       BP:           133/76 mmHg Patient Gender: F              HR:           71 bpm. Exam Location:  Inpatient Procedure: 2D Echo (Both Spectral and Color Flow Doppler were utilized during            procedure). Indications:     Bactermia  History:         Patient has no  prior history of Echocardiogram examinations.                  Signs/Symptoms:Bacteremia.  Sonographer:     Norleen Amour Referring Phys:  [id number] COREAN N DIXON Diagnosing Phys: Kardie Tobb DO IMPRESSIONS  1. Left ventricular ejection fraction, by estimation, is 60 to 65%. The left ventricle has normal function. The left ventricle has no regional wall motion abnormalities. Left ventricular diastolic parameters were normal.  2. Right ventricular systolic function is normal. The right ventricular size is normal. Tricuspid regurgitation signal is inadequate for assessing PA pressure.  3. The mitral valve is normal in structure. Mild mitral valve regurgitation. No evidence of mitral stenosis.  4. Mobile mass (0.52 cm x 0.25 cm) on the noncoronary cusp of the aortic valve:  calcification vs vegetation. In the clinical setting of bacterimia recommend TEE for clarity. The aortic valve is normal in structure. Aortic valve regurgitation is not visualized. Aortic valve sclerosis/calcification is present, without any evidence of aortic stenosis.  5. The inferior vena cava is dilated in size with <50% respiratory variability, suggesting right atrial pressure of 15 mmHg. Conclusion(s)/Recommendation(s): Findings concerning for mobile mass , would recommend Transesophageal Echocardiogram for clarification. FINDINGS  Left Ventricle: Left ventricular ejection fraction, by estimation, is 60 to 65%. The left ventricle has normal function. The left ventricle has no regional wall motion abnormalities. The left ventricular internal cavity size was normal in size. There is  no left ventricular hypertrophy. Left ventricular diastolic parameters were normal. Right Ventricle: The right ventricular size is normal. No increase in right ventricular wall thickness. Right ventricular systolic function is normal. Tricuspid regurgitation signal is inadequate for assessing PA pressure. Left Atrium: Left atrial size was normal in size. Right Atrium: Right atrial size was normal in size. Pericardium: There is no evidence of pericardial effusion. Mitral Valve: The mitral valve is normal in structure. Mild mitral valve regurgitation. No evidence of mitral valve stenosis. MV peak gradient, 3.3 mmHg. The mean mitral valve gradient is 2.0 mmHg. Tricuspid Valve: The tricuspid valve is normal in structure. Tricuspid valve regurgitation is trivial. No evidence of tricuspid stenosis. Aortic Valve: Mobile mass (0.52 cm x 0.25 cm) on the noncoronary cusp of the aortic valve: calcification vs vegetation. In the clinical setting of bacterimia recommend TEE for clarity. The aortic valve is normal in structure. Aortic valve regurgitation is not visualized. Aortic valve sclerosis/calcification is present, without any evidence of  aortic stenosis. Aortic valve mean gradient measures 3.0 mmHg. Aortic valve peak gradient measures 6.2 mmHg. Aortic valve area, by VTI measures 1.73 cm. Pulmonic Valve: The pulmonic valve was normal in structure. Pulmonic valve regurgitation is not visualized. No evidence of pulmonic stenosis. Aorta: The aortic root is normal in size and structure. Venous: The inferior vena cava is dilated in size with less than 50% respiratory variability, suggesting right atrial pressure of 15 mmHg. IAS/Shunts: No atrial level shunt detected by color flow Doppler.  LEFT VENTRICLE PLAX 2D LVIDd:         4.40 cm     Diastology LVIDs:         3.00 cm     LV e' medial:    7.29 cm/s LV PW:         0.80 cm     LV E/e' medial:  16.0 LV IVS:        0.70 cm     LV e' lateral:   9.57 cm/s LVOT diam:     1.90 cm  LV E/e' lateral: 12.2 LV SV:         48 LV SV Index:   30 LVOT Area:     2.84 cm LV IVRT:       87 msec  LV Volumes (MOD) LV vol d, MOD A2C: 74.5 ml LV vol d, MOD A4C: 78.9 ml LV vol s, MOD A2C: 36.2 ml LV vol s, MOD A4C: 38.8 ml LV SV MOD A2C:     38.3 ml LV SV MOD A4C:     78.9 ml LV SV MOD BP:      38.8 ml RIGHT VENTRICLE             IVC RV Basal diam:  2.90 cm     IVC diam: 2.40 cm RV S prime:     11.40 cm/s TAPSE (M-mode): 2.2 cm      PULMONARY VEINS                             Diastolic Velocity: 48.80 cm/s                             S/D Velocity:       1.00                             Systolic Velocity:  48.80 cm/s LEFT ATRIUM           Index        RIGHT ATRIUM           Index LA diam:      3.00 cm 1.87 cm/m   RA Area:     11.10 cm LA Vol (A4C): 54.0 ml 33.75 ml/m  RA Volume:   22.20 ml  13.87 ml/m  AORTIC VALVE                    PULMONIC VALVE AV Area (Vmax):    1.58 cm     PV Vmax:       0.85 m/s AV Area (Vmean):   1.62 cm     PV Peak grad:  2.9 mmHg AV Area (VTI):     1.73 cm AV Vmax:           125.00 cm/s AV Vmean:          87.400 cm/s AV VTI:            0.276 m AV Peak Grad:      6.2 mmHg AV Mean Grad:       3.0 mmHg LVOT Vmax:         69.50 cm/s LVOT Vmean:        49.800 cm/s LVOT VTI:          0.168 m LVOT/AV VTI ratio: 0.61  AORTA Ao Root diam: 2.50 cm Ao Asc diam:  3.00 cm MITRAL VALVE MV Area (PHT): 4.63 cm     SHUNTS MV Area VTI:   2.01 cm     Systemic VTI:  0.17 m MV Peak grad:  3.3 mmHg     Systemic Diam: 1.90 cm MV Mean grad:  2.0 mmHg MV Vmax:       0.91 m/s MV Vmean:      63.4 cm/s MV Decel Time: 164 msec MV E velocity: 117.00 cm/s MV A velocity: 95.90 cm/s MV E/A ratio:  1.22 Kardie  Tobb DO Electronically signed by Dub Huntsman DO Signature Date/Time: 01/09/2024/1:04:18 PM    Final (Updated)         Scheduled Meds:  fluticasone  furoate-vilanterol  1 puff Inhalation Daily   morphine  30 mg Oral Q12H   nicotine  21 mg Transdermal Daily   pantoprazole  40 mg Oral Daily   polyethylene glycol  17 g Oral Daily   progesterone  100 mg Oral QHS   senna-docusate  2 tablet Oral QHS   sodium chloride flush  3 mL Intravenous Q12H   thyroid   75 mg Oral QAC breakfast   Continuous Infusions:  piperacillin-tazobactam (ZOSYN)  IV 3.375 g (01/10/24 0622)   potassium PHOSPHATE IVPB (in mmol) 20 mmol (01/10/24 1300)   vancomycin 750 mg (01/09/24 2150)     LOS: 2 days       Renato Applebaum, MD [facility]

## 2024-01-10 NOTE — Transfer of Care (Signed)
 Immediate Anesthesia Transfer of Care Note  Patient: Molly Myers  Procedure(s) Performed: TRANSESOPHAGEAL ECHOCARDIOGRAM  Patient Location: Cath Lab  Anesthesia Type:MAC  Level of Consciousness: awake, sedated, drowsy, and responds to stimulation  Airway & Oxygen Therapy: Patient Spontanous Breathing  Post-op Assessment: Report given to RN and Post -op Vital signs reviewed and stable  Post vital signs: Reviewed and stable  Last Vitals:  Vitals Value Taken Time  BP    Temp    Pulse    Resp    SpO2      Last Pain:  Vitals:   01/10/24 1116  TempSrc:   PainSc: 0-No pain      Patients Stated Pain Goal: 0 (01/08/24 2003)  Complications: No notable events documented.

## 2024-01-10 NOTE — CV Procedure (Signed)
    TRANSESOPHAGEAL ECHOCARDIOGRAM   NAME:  Molly Myers    MRN: [id number] DOB:  08-May-1950    ADMIT DATE: 01/07/2024  INDICATIONS: Bacteremia   PROCEDURE:   Informed consent was obtained prior to the procedure. The risks, benefits and alternatives for the procedure were discussed and the patient comprehended these risks.  Risks include, but are not limited to, cough, sore throat, vomiting, nausea, somnolence, esophageal and stomach trauma or perforation, bleeding, low blood pressure, aspiration, pneumonia, infection, trauma to the teeth and death.    Procedural time out performed. The oropharynx was anesthetized with viscous lidocaine.  Anesthesia was administered by the anaesthesilogy team to achieve and maintain moderate to deep conscious sedation.  The patient's heart rate, blood pressure, and oxygen saturation were monitored continuously during the procedure.  The transesophageal probe was inserted in the esophagus and stomach without difficulty and multiple views were obtained.   The patient tolerated the procedure well.  COMPLICATIONS:    There were no immediate complications.  KEY FINDINGS:  Normal Ejection fraction, aortic valve with some calcification, valve with intergrity doubt this is a vegetation Full report to follow. Further management per primary team.   Velda Wendt, DO [facility]  [facility]  11:42 AM

## 2024-01-10 NOTE — Plan of Care (Signed)

## 2024-01-10 NOTE — Progress Notes (Signed)
  Echocardiogram Echocardiogram Transesophageal has been performed.  Molly Myers, RDCS 01/10/2024, 11:45 AM

## 2024-01-10 NOTE — Anesthesia Postprocedure Evaluation (Signed)
 Anesthesia Post Note  Patient: Molly Myers  Procedure(s) Performed: TRANSESOPHAGEAL ECHOCARDIOGRAM     Patient location during evaluation: PACU Anesthesia Type: MAC Level of consciousness: awake and alert Pain management: pain level controlled Vital Signs Assessment: post-procedure vital signs reviewed and stable Respiratory status: spontaneous breathing, nonlabored ventilation, respiratory function stable and patient connected to nasal cannula oxygen Cardiovascular status: stable and blood pressure returned to baseline Postop Assessment: no apparent nausea or vomiting Anesthetic complications: no   No notable events documented.  Last Vitals:  Vitals:   01/10/24 1220 01/10/24 1240  BP: (!) 156/98 (!) 160/86  Pulse: 63 65  Resp: 20 18  Temp:  36.4 C  SpO2: 98% 98%    Last Pain:  Vitals:   01/10/24 1240  TempSrc: Oral  PainSc:                  Thom JONELLE Peoples

## 2024-01-11 ENCOUNTER — Other Ambulatory Visit: Payer: Self-pay | Admitting: Infectious Disease

## 2024-01-11 ENCOUNTER — Other Ambulatory Visit (HOSPITAL_COMMUNITY): Payer: Self-pay

## 2024-01-11 ENCOUNTER — Encounter (HOSPITAL_COMMUNITY): Payer: Self-pay | Admitting: Cardiology

## 2024-01-11 DIAGNOSIS — B9620 Unspecified Escherichia coli [E. coli] as the cause of diseases classified elsewhere: Secondary | ICD-10-CM | POA: Diagnosis not present

## 2024-01-11 DIAGNOSIS — K8033 Calculus of bile duct with acute cholangitis with obstruction: Secondary | ICD-10-CM

## 2024-01-11 DIAGNOSIS — A499 Bacterial infection, unspecified: Secondary | ICD-10-CM | POA: Diagnosis not present

## 2024-01-11 DIAGNOSIS — R7881 Bacteremia: Secondary | ICD-10-CM | POA: Diagnosis not present

## 2024-01-11 DIAGNOSIS — B952 Enterococcus as the cause of diseases classified elsewhere: Secondary | ICD-10-CM | POA: Diagnosis not present

## 2024-01-11 LAB — HCV RNA DIAGNOSIS, NAA: HCV RNA, Quantitation: NOT DETECTED [IU]/mL

## 2024-01-11 LAB — CULTURE, BLOOD (ROUTINE X 2)
Special Requests: ADEQUATE
Special Requests: ADEQUATE

## 2024-01-11 LAB — SURGICAL PATHOLOGY

## 2024-01-11 MED ORDER — CIPROFLOXACIN HCL 500 MG PO TABS
500.0000 mg | ORAL_TABLET | Freq: Two times a day (BID) | ORAL | 0 refills | Status: AC
  Filled 2024-01-11: qty 36, 18d supply, fill #0

## 2024-01-11 MED ORDER — AMOXICILLIN 500 MG PO CAPS
1000.0000 mg | ORAL_CAPSULE | Freq: Three times a day (TID) | ORAL | Status: DC
  Administered 2024-01-11: 1000 mg via ORAL
  Filled 2024-01-11: qty 2

## 2024-01-11 MED ORDER — NICOTINE 21 MG/24HR TD PT24: 21.0000 mg | MEDICATED_PATCH | TRANSDERMAL | 0 refills | Status: AC

## 2024-01-11 MED ORDER — AMOXICILLIN 500 MG PO CAPS
1000.0000 mg | ORAL_CAPSULE | Freq: Three times a day (TID) | ORAL | 0 refills | Status: DC
  Filled 2024-01-11: qty 108, 18d supply, fill #0

## 2024-01-11 MED ORDER — NICOTINE 21 MG/24HR TD PT24
21.0000 mg | MEDICATED_PATCH | Freq: Every day | TRANSDERMAL | 0 refills | Status: AC
  Filled 2024-01-11: qty 28, 28d supply, fill #0

## 2024-01-11 MED ORDER — CIPROFLOXACIN HCL 500 MG PO TABS
500.0000 mg | ORAL_TABLET | Freq: Two times a day (BID) | ORAL | Status: DC
  Administered 2024-01-11: 500 mg via ORAL
  Filled 2024-01-11: qty 1

## 2024-01-11 NOTE — TOC Transition Note (Signed)
 Transition of Care [facility]) - Discharge Note   Patient Details  Name: Molly Myers MRN: [id number] Date of Birth: [birth year]  Transition [facility] CM/SW Contact:  Sudie Erminio Deems, RN Phone Number: 01/11/2024, 11:30 AM   Clinical Narrative:  No home needs identified. Patient to transition home today via private vehicle.    Final next level of care: Home/Self Care Barriers to Discharge: No Barriers Identified   Patient Goals and CMS Choice Patient states their goals for this hospitalization and ongoing recovery are:: Plans to return home onces stable.   Choice offered to / list presented to : NA   Discharge Plan and Services Additional resources added to the After Visit Summary for   In-house Referral: NA Discharge Planning Services: CM Consult Post Acute Care Choice: NA            DME Agency: NA       HH Arranged: NA   Social Drivers of Health (SDOH) Interventions SDOH Screenings   Food Insecurity: No Food Insecurity (01/07/2024)  Housing: Low Risk  (01/07/2024)  Transportation Needs: No Transportation Needs (01/07/2024)  Utilities: Not At Risk (01/07/2024)  Social Connections: Moderately Isolated (01/07/2024)  Tobacco Use: High Risk (01/08/2024)     Readmission Risk Interventions     No data to display

## 2024-01-11 NOTE — Discharge Summary (Signed)
 Physician Discharge Summary  Molly Myers FMW:[id number] DOB: 22-Aug-1950 DOA: 01/07/2024  PCP: Clarice Nottingham, MD  Admit date: 01/07/2024 Discharge date: 01/11/2024  Admitted From: Home Disposition: Home  Recommendations for Outpatient Follow-up:  Follow up with PCP in 1-2 weeks GI to schedule follow-up ID clinic to schedule follow-up  Home Health: N/A Equipment/Devices: N/A  Discharge Condition: Stable CODE STATUS: Full code Diet recommendation: Regular diet  Discharge summary: 73 year old with history of cholecystectomy almost 30 years ago, hypothyroidism, chronic back pain presented with confusion, fever, shortness of breath.  She was found to have cholecystitis and pneumonia as well as bacteremia. Underwent ERCP, biliary sphincterotomy sphincteroplasty.  Purulent cholangitis noted. Followed by ID, GI. Clinically improved.  Going home with oral antibiotics as below.   Sepsis secondary to cholangitis, pneumonia, polymicrobial bacteremia. Choledocholithiasis status postcholecystectomy   Presented with fever, leukocytosis with evidence of cholangitis. MRCP with choledocholithiasis and dilated common bile duct 1.6 cm ERCP with choledocholithiasis, complete removal by biliary sphincterotomy and balloon sphincteroplasty-biliary stent placed, purulent drainage-stent removal in 2 months that GI will schedule LFTs improving and normalized. Blood cultures 11/30 E. coli, Enterococcus faecium Blood cultures 12/1, negative so far TTE 12/2, suspected aortic calcification versus vegetation TEE 12/3, no evidence of aortic vegetation. Clinically improving.  ID recommended oral ciprofloxacin  and amoxicillin  for additional 18 days to complete 3 weeks of therapy.    Hypophosphatemia: Replaced.  Will send home with some oral replacement.    Acute metabolic infective encephalopathy: Normalized.  Asthma: Stable.  Hypothyroidism: On Synthroid  Chronic pain syndrome: On long acting morphine   and oxycodone .  Continued.  Medically stabilized to go home with close outpatient follow-up.  Discharge Diagnoses:  Principal Problem:   Polymicrobial bacteremia Active Problems:   Cholangitis (HCC)   Abnormal LFTs   Choledocholithiasis   Bacteremia due to Gram-negative bacteria   Abdominal pain, RUQ   Erosive gastritis   Sepsis (HCC)   Community acquired pneumonia of right lower lobe of lung    Discharge Instructions  Discharge Instructions     Diet general   Complete by: As directed    Increase activity slowly   Complete by: As directed       Allergies as of 01/11/2024       Reactions   Iodinated Contrast Media Shortness Of Breath, Rash   Ery-tab [erythromycin] Nausea And Vomiting   Statins Other (See Comments)   Myalgias  Fatigue         Medication List     STOP taking these medications    ergocalciferol 1.25 MG (50000 UT) capsule Commonly known as: VITAMIN D2       TAKE these medications    Advair Diskus 250-50 MCG/DOSE Aepb Generic drug: fluticasone -salmeterol INHALE 1 PUFF INTO THE LUNGS EVERY 12 HOURS NEED APPOINTMENT FOR FURTHER REFILLS What changed: See the new instructions.   albuterol  108 (90 Base) MCG/ACT inhaler Commonly known as: VENTOLIN  HFA Inhale 1-2 puffs into the lungs every 6 (six) hours as needed for wheezing or shortness of breath.   amoxicillin  500 MG capsule Commonly known as: AMOXIL  Take 2 capsules (1,000 mg total) by mouth every 8 (eight) hours for 18 days.   aspirin EC 81 MG tablet Take 81 mg by mouth at bedtime.   ciprofloxacin  500 MG tablet Commonly known as: CIPRO  Take 1 tablet (500 mg total) by mouth 2 (two) times daily for 18 days.   denosumab 60 MG/ML Sosy injection Commonly known as: PROLIA Inject 60 mg into the skin every 6 (  six) months.   morphine  30 MG 12 hr tablet Commonly known as: MS CONTIN  Take 30 mg by mouth every 12 (twelve) hours.   Oxycodone  HCl 10 MG Tabs Take 10 mg by mouth See admin  instructions. Take 1 tablet (10mg ) by mouth three times a day: in the morning (with 1st morphine  dose), 1 tablet around noon, and 1 tablet in the afternoon around 1630(with 2nd morphine  dose). Take 1 additional tablet at bedtime as needed for breakthrough pain.   progesterone  100 MG capsule Commonly known as: PROMETRIUM  Take 100 mg by mouth at bedtime.   Repatha SureClick 140 MG/ML Soaj Generic drug: Evolocumab Inject 140 mg into the skin every 28 (twenty-eight) days.   thyroid  15 MG tablet Commonly known as: ARMOUR Take 15 mg by mouth daily. take in addition to a 60mg  tablet to equal total daily dose of 75mg .   thyroid  60 MG tablet Commonly known as: ARMOUR Take 60 mg by mouth daily. take in addition to a 15mg  tablet to equal total daily dose of 75mg .   triamterene-hydrochlorothiazide 37.5-25 MG tablet Commonly known as: MAXZIDE-25 Take 1 tablet by mouth daily as needed (lower extremity swelling).        Allergies  Allergen Reactions   Iodinated Contrast Media Shortness Of Breath and Rash   Ery-Tab [Erythromycin] Nausea And Vomiting   Statins Other (See Comments)    Myalgias  Fatigue     Consultations: GI ID   Procedures/Studies: ECHO TEE Result Date: 01/10/2024    TRANSESOPHOGEAL ECHO REPORT   Patient Name:   Molly Myers Date of Exam: 01/10/2024 Medical Rec #:  [id number]      Height:       63.0 in Accession #:    [id number]     Weight:       128.1 lb Date of Birth:  [birth year]       BSA:          1.600 m Patient Age:    73 years       BP:           159/85 mmHg Patient Gender: F              HR:           79 bpm. Exam Location:  Inpatient Procedure: Transesophageal Echo, 3D Echo, Limited Color Doppler and Cardiac            Doppler (Both Spectral and Color Flow Doppler were utilized during            procedure). Indications:     Endocarditis  History:         Patient has prior history of Echocardiogram examinations, most                  recent 01/09/2024.  Signs/Symptoms:Bacteremia; Risk                  Factors:Current Smoker.  Sonographer:     Koleen Popper RDCS Referring Phys:  [id number] ZANE ADAMS Diagnosing Phys: Kardie Tobb DO PROCEDURE: After discussion of the risks and benefits of a TEE, an informed consent was obtained from the patient. The transesophogeal probe was passed without difficulty through the esophogus of the patient. Imaged were obtained with the patient in a left lateral decubitus position. Sedation performed by different physician. The patient was monitored while under deep sedation. Anesthestetic sedation was provided intravenously by Anesthesiology: 220mg  of Propofol , 100mg  of Lidocaine . Image quality was  good. The patient's vital  signs; including heart rate, blood pressure, and oxygen saturation; remained stable throughout the procedure. The patient developed no complications during the procedure.  IMPRESSIONS  1. Left ventricular ejection fraction, by estimation, is 55 to 60%. The left ventricle has normal function.  2. Right ventricular systolic function is normal. The right ventricular size is normal.  3. No left atrial/left atrial appendage thrombus was detected. The LAA emptying velocity was 44 cm/s.  4. The mitral valve is abnormal. Mild to moderate mitral valve regurgitation.  5. The aortic valve is normal in structure. There is mild calcification of the aortic valve. There is mild thickening of the aortic valve. Aortic valve regurgitation is trivial. Aortic valve sclerosis/calcification is present, without any evidence of aortic stenosis.  6. 3D performed of the aortic valve. Conclusion(s)/Recommendation(s): Normal biventricular function without evidence of hemodynamically significant valvular heart disease. FINDINGS  Left Ventricle: Left ventricular ejection fraction, by estimation, is 55 to 60%. The left ventricle has normal function. The left ventricular internal cavity size was normal in size. Right Ventricle: The right  ventricular size is normal. No increase in right ventricular wall thickness. Right ventricular systolic function is normal. Left Atrium: Left atrial size was normal in size. No left atrial/left atrial appendage thrombus was detected. The LAA emptying velocity was 44 cm/s. Right Atrium: Right atrial size was not well visualized. Pericardium: There is no evidence of pericardial effusion. Mitral Valve: The mitral valve is abnormal. Mild to moderate mitral valve regurgitation. Tricuspid Valve: The tricuspid valve is normal in structure. Aortic Valve: The aortic valve is normal in structure. There is mild calcification of the aortic valve. There is mild thickening of the aortic valve. Aortic valve regurgitation is trivial. Aortic valve sclerosis/calcification is present, without any evidence of aortic stenosis. Pulmonic Valve: The pulmonic valve was not well visualized. Pulmonic valve regurgitation is trivial. Aorta: The aortic root and ascending aorta are structurally normal, with no evidence of dilitation. Venous: The left upper pulmonary vein, left lower pulmonary vein, right upper pulmonary vein and right lower pulmonary vein are normal. A normal flow pattern is recorded from the left upper pulmonary vein. IAS/Shunts: The interatrial septum was not well visualized. Additional Comments: Spectral Doppler performed. LEFT VENTRICLE PLAX 2D LVOT diam:     1.70 cm LVOT Area:     2.27 cm   AORTA Ao Root diam: 2.90 cm Ao Asc diam:  3.20 cm  SHUNTS Systemic Diam: 1.70 cm Kardie Tobb DO Electronically signed by Dub Huntsman DO Signature Date/Time: 01/10/2024/4:10:59 PM    Final    EP STUDY Result Date: 01/10/2024 See surgical note for result.  DG ERCP Result Date: 01/10/2024 CLINICAL DATA:  [id number] Surgery, elective [phone]. Choledocholithiasis. EXAM: ERCP COMPARISON:  MRCP, 01/07/2024.  US  Abdomen, 01/07/2024. FLUOROSCOPY: Exposure Index (as provided by the fluoroscopic device): 11.1 mGy Kerma FINDINGS: Limited oblique  planar images of the RIGHT upper quadrant obtained C-arm. Images demonstrating flexible endoscopy, biliary duct cannulation, sphincterotomy, retrograde cholangiogram and balloon sweep. Biliary ductal dilation, with evidence of biliary filling defects demonstrated. Plastic biliary stent placement IMPRESSION: Fluoroscopic imaging for ERCP, demonstrating choledocholithiasis and plastic biliary stent placement For complete description of intra procedural findings, please see performing service dictation. Electronically Signed   By: Thom Hall M.D.   On: 01/10/2024 08:56   ECHOCARDIOGRAM COMPLETE Result Date: 01/09/2024    ECHOCARDIOGRAM REPORT   Patient Name:   EILYN POLACK Date of Exam: 01/09/2024 Medical Rec #:  [id number]      Height:  63.0 in Accession #:    [id number]     Weight:       128.1 lb Date of Birth:  1950-11-15       BSA:          1.600 m Patient Age:    73 years       BP:           133/76 mmHg Patient Gender: F              HR:           71 bpm. Exam Location:  Inpatient Procedure: 2D Echo (Both Spectral and Color Flow Doppler were utilized during            procedure). Indications:     Bactermia  History:         Patient has no prior history of Echocardiogram examinations.                  Signs/Symptoms:Bacteremia.  Sonographer:     Norleen Amour Referring Phys:  [id number] COREAN N DIXON Diagnosing Phys: Kardie Tobb DO IMPRESSIONS  1. Left ventricular ejection fraction, by estimation, is 60 to 65%. The left ventricle has normal function. The left ventricle has no regional wall motion abnormalities. Left ventricular diastolic parameters were normal.  2. Right ventricular systolic function is normal. The right ventricular size is normal. Tricuspid regurgitation signal is inadequate for assessing PA pressure.  3. The mitral valve is normal in structure. Mild mitral valve regurgitation. No evidence of mitral stenosis.  4. Mobile mass (0.52 cm x 0.25 cm) on the noncoronary cusp of the aortic valve:  calcification vs vegetation. In the clinical setting of bacterimia recommend TEE for clarity. The aortic valve is normal in structure. Aortic valve regurgitation is not visualized. Aortic valve sclerosis/calcification is present, without any evidence of aortic stenosis.  5. The inferior vena cava is dilated in size with <50% respiratory variability, suggesting right atrial pressure of 15 mmHg. Conclusion(s)/Recommendation(s): Findings concerning for mobile mass , would recommend Transesophageal Echocardiogram for clarification. FINDINGS  Left Ventricle: Left ventricular ejection fraction, by estimation, is 60 to 65%. The left ventricle has normal function. The left ventricle has no regional wall motion abnormalities. The left ventricular internal cavity size was normal in size. There is  no left ventricular hypertrophy. Left ventricular diastolic parameters were normal. Right Ventricle: The right ventricular size is normal. No increase in right ventricular wall thickness. Right ventricular systolic function is normal. Tricuspid regurgitation signal is inadequate for assessing PA pressure. Left Atrium: Left atrial size was normal in size. Right Atrium: Right atrial size was normal in size. Pericardium: There is no evidence of pericardial effusion. Mitral Valve: The mitral valve is normal in structure. Mild mitral valve regurgitation. No evidence of mitral valve stenosis. MV peak gradient, 3.3 mmHg. The mean mitral valve gradient is 2.0 mmHg. Tricuspid Valve: The tricuspid valve is normal in structure. Tricuspid valve regurgitation is trivial. No evidence of tricuspid stenosis. Aortic Valve: Mobile mass (0.52 cm x 0.25 cm) on the noncoronary cusp of the aortic valve: calcification vs vegetation. In the clinical setting of bacterimia recommend TEE for clarity. The aortic valve is normal in structure. Aortic valve regurgitation is not visualized. Aortic valve sclerosis/calcification is present, without any evidence of  aortic stenosis. Aortic valve mean gradient measures 3.0 mmHg. Aortic valve peak gradient measures 6.2 mmHg. Aortic valve area, by VTI measures 1.73 cm. Pulmonic Valve: The pulmonic valve was normal in structure. Pulmonic valve  regurgitation is not visualized. No evidence of pulmonic stenosis. Aorta: The aortic root is normal in size and structure. Venous: The inferior vena cava is dilated in size with less than 50% respiratory variability, suggesting right atrial pressure of 15 mmHg. IAS/Shunts: No atrial level shunt detected by color flow Doppler.  LEFT VENTRICLE PLAX 2D LVIDd:         4.40 cm     Diastology LVIDs:         3.00 cm     LV e' medial:    7.29 cm/s LV PW:         0.80 cm     LV E/e' medial:  16.0 LV IVS:        0.70 cm     LV e' lateral:   9.57 cm/s LVOT diam:     1.90 cm     LV E/e' lateral: 12.2 LV SV:         48 LV SV Index:   30 LVOT Area:     2.84 cm LV IVRT:       87 msec  LV Volumes (MOD) LV vol d, MOD A2C: 74.5 ml LV vol d, MOD A4C: 78.9 ml LV vol s, MOD A2C: 36.2 ml LV vol s, MOD A4C: 38.8 ml LV SV MOD A2C:     38.3 ml LV SV MOD A4C:     78.9 ml LV SV MOD BP:      38.8 ml RIGHT VENTRICLE             IVC RV Basal diam:  2.90 cm     IVC diam: 2.40 cm RV S prime:     11.40 cm/s TAPSE (M-mode): 2.2 cm      PULMONARY VEINS                             Diastolic Velocity: 48.80 cm/s                             S/D Velocity:       1.00                             Systolic Velocity:  48.80 cm/s LEFT ATRIUM           Index        RIGHT ATRIUM           Index LA diam:      3.00 cm 1.87 cm/m   RA Area:     11.10 cm LA Vol (A4C): 54.0 ml 33.75 ml/m  RA Volume:   22.20 ml  13.87 ml/m  AORTIC VALVE                    PULMONIC VALVE AV Area (Vmax):    1.58 cm     PV Vmax:       0.85 m/s AV Area (Vmean):   1.62 cm     PV Peak grad:  2.9 mmHg AV Area (VTI):     1.73 cm AV Vmax:           125.00 cm/s AV Vmean:          87.400 cm/s AV VTI:            0.276 m AV Peak Grad:      6.2 mmHg AV Mean  Grad:       3.0 mmHg LVOT Vmax:         69.50 cm/s LVOT Vmean:        49.800 cm/s LVOT VTI:          0.168 m LVOT/AV VTI ratio: 0.61  AORTA Ao Root diam: 2.50 cm Ao Asc diam:  3.00 cm MITRAL VALVE MV Area (PHT): 4.63 cm     SHUNTS MV Area VTI:   2.01 cm     Systemic VTI:  0.17 m MV Peak grad:  3.3 mmHg     Systemic Diam: 1.90 cm MV Mean grad:  2.0 mmHg MV Vmax:       0.91 m/s MV Vmean:      63.4 cm/s MV Decel Time: 164 msec MV E velocity: 117.00 cm/s MV A velocity: 95.90 cm/s MV E/A ratio:  1.22 Kardie Tobb DO Electronically signed by Dub Huntsman DO Signature Date/Time: 01/09/2024/1:04:18 PM    Final (Updated)    MR ABDOMEN MRCP W WO CONTAST Result Date: 01/08/2024 EXAM: MRCP WITHOUT AND WITH IV CONTRAST 01/07/2024 10:41:25 PM TECHNIQUE: Multisequence, multiplanar magnetic resonance images of the abdomen without and with intravenous contrast. MRCP sequences were performed. CONTRAST: 6 mL of GADAVIST . COMPARISON: US  Abdomen limited 01/07/2024 3:59:47 PM. CLINICAL HISTORY: Biliary obstruction suspected (Ped 0-17y). FINDINGS: LIVER: No suspicious focal liver lesion identified. Trace fluid along the inferior margin of right lobe of liver. GALLBLADDER AND BILIARY SYSTEM: Status post cholecystectomy. The common bile duct is dilated measuring up to 1.6 cm. There are numerous stones filling the mid and distal common bile duct which measure up to 8 mm. Moderate intrahepatic bile duct dilatation. SPLEEN: The spleen is within normal limits in size and appearance. PANCREAS/PANCREATIC DUCT: Possible edema involving the head and uncinate process of the pancreas identified with a small volume of free fluid along the pancreaticoduodenal groove. No pancreatic ductal dilatation. ADRENAL GLANDS: Normal size and morphology bilaterally. No nodule, thickening, or hemorrhage. No periadrenal stranding. KIDNEYS: Bilateral perinephric soft tissue stranding, nonspecific. LYMPH NODES: Upper limits of normal lymph nodes within the upper abdomen,  likely reactive. VASCULATURE: Unremarkable. PERITONEUM: No ascites. ABDOMINAL WALL: No hernia. No mass. BOWEL: Grossly unremarkable. No bowel obstruction. BONES: Lumbar scoliosis with convexity towards the left. No acute abnormality or worrisome osseous lesion. SOFT TISSUES: Unremarkable. MISCELLANEOUS: Subsegmental atelectasis identified within the lung bases. IMPRESSION: 1. Status post cholecystectomy. Choledocholithiasis with dilated common bile duct measuring up to 1.6 cm and stones up to 8 mm in the mid to distal duct. 2. 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 Electronically signed by: Waddell Calk MD 01/08/2024 05:19 AM EST RP Workstation: [id number]   MR 3D Recon At Scanner Result Date: 01/08/2024 EXAM: MRCP WITHOUT AND WITH IV CONTRAST 01/07/2024 10:41:25 PM TECHNIQUE: Multisequence, multiplanar magnetic resonance images of the abdomen without and with intravenous contrast. MRCP sequences were performed. CONTRAST: 6 mL of GADAVIST . COMPARISON: US  Abdomen limited 01/07/2024 3:59:47 PM. CLINICAL HISTORY: Biliary obstruction suspected (Ped 0-17y). FINDINGS: LIVER: No suspicious focal liver lesion identified. Trace fluid along the inferior margin of right lobe of liver. GALLBLADDER AND BILIARY SYSTEM: Status post cholecystectomy. The common bile duct is dilated measuring up to 1.6 cm. There are numerous stones filling the mid and distal common bile duct which measure up to 8 mm. Moderate intrahepatic bile duct dilatation. SPLEEN: The spleen is within normal limits in size and appearance. PANCREAS/PANCREATIC DUCT: Possible edema involving the head  and uncinate process of the pancreas identified with a small volume of free fluid along the pancreaticoduodenal groove. No pancreatic ductal dilatation. ADRENAL GLANDS: Normal size and morphology bilaterally. No nodule, thickening, or hemorrhage. No periadrenal  stranding. KIDNEYS: Bilateral perinephric soft tissue stranding, nonspecific. LYMPH NODES: Upper limits of normal lymph nodes within the upper abdomen, likely reactive. VASCULATURE: Unremarkable. PERITONEUM: No ascites. ABDOMINAL WALL: No hernia. No mass. BOWEL: Grossly unremarkable. No bowel obstruction. BONES: Lumbar scoliosis with convexity towards the left. No acute abnormality or worrisome osseous lesion. SOFT TISSUES: Unremarkable. MISCELLANEOUS: Subsegmental atelectasis identified within the lung bases. IMPRESSION: 1. Status post cholecystectomy. Choledocholithiasis with dilated common bile duct measuring up to 1.6 cm and stones up to 8 mm in the mid to distal duct. 2. 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 Electronically signed by: Waddell Calk MD 01/08/2024 05:19 AM EST RP Workstation: [id number]   US  Abdomen Limited RUQ (LIVER/GB) Result Date: 01/07/2024 CLINICAL DATA:  Right upper quadrant abdominal pain. Obstructive transaminitis. History of cholecystectomy. EXAM: ULTRASOUND ABDOMEN LIMITED RIGHT UPPER QUADRANT COMPARISON:  Ultrasound 07/31/2023.  CT 09/17/2023. FINDINGS: Gallbladder: Surgically absent.  No Murphy sign in the region. Common bile duct: Diameter: Dilated biliary ductal tree with the common duct measuring 13.8 mm. Intrahepatic ductal dilatation present as well. Liver: Heterogeneous echotexture without definite focal lesion. Findings probably relate to steatosis. Portal vein is patent on color Doppler imaging with normal direction of blood flow towards the liver. Other: None. IMPRESSION: 1. Previous cholecystectomy. Dilated biliary ductal tree with the common duct measuring 13.8 mm. Intrahepatic ductal dilatation present as well. Similar to prior recent examinations. This ductal dilatation could be due to postsurgical dilatation in an elderly patient, but the possibility of a distal  common duct obstruction is not excluded. 2. Heterogeneous echotexture of the liver probably related to steatosis. Electronically Signed   By: Oneil Officer M.D.   On: 01/07/2024 16:47   CT Head Wo Contrast Result Date: 01/07/2024 EXAM: CT HEAD WITHOUT 01/07/2024 10:47:07 AM TECHNIQUE: CT of the head was performed without the administration of intravenous contrast. Automated exposure control, iterative reconstruction, and/or weight based adjustment of the mA/kV was utilized to reduce the radiation dose to as low as reasonably achievable. COMPARISON: 09/17/2023 CLINICAL HISTORY: ams FINDINGS: BRAIN AND VENTRICLES: No acute intracranial hemorrhage. No mass effect or midline shift. No extra-axial fluid collection. No evidence of acute infarct. No hydrocephalus. ORBITS: Bilateral lens replacement. SINUSES AND MASTOIDS: No acute abnormality. SOFT TISSUES AND SKULL: No acute skull fracture. No acute soft tissue abnormality. IMPRESSION: 1. No acute intracranial abnormality. Electronically signed by: Franky Stanford MD 01/07/2024 11:06 AM EST RP Workstation: [id number]   DG Chest Port 1 View Result Date: 01/07/2024 CLINICAL DATA:  Lethargy. Decreased responsiveness. Tachycardia with decreased oxygen saturation on room air. EXAM: PORTABLE CHEST 1 VIEW COMPARISON:  CT, 09/17/2023 and older exams. FINDINGS: Cardiac silhouette is normal in size. No mediastinal or hilar masses. Streaky opacities noted at the lung bases, right greater than left. Remainder of the lungs is clear. No convincing pleural effusion or pneumothorax. Skeletal structures are demineralized, grossly intact. IMPRESSION: 1. Lung base opacities greater on the right. Right lung base opacity appears new from the CT dated 09/17/2023 and is suspicious for pneumonia. Electronically Signed   By: Alm Parkins M.D.   On: 01/07/2024 10:51   (Echo, Carotid, EGD, Colonoscopy, ERCP)    Subjective: Patient seen and examined.  Denies any complaints.  Eager  to go  home on my exam.  Later on discussed with ID team who recommended oral antibiotics.  Patient is going home today.  Husband at bedside.   Discharge Exam: Vitals:   01/11/24 0440 01/11/24 0901  BP: (!) 158/82 (!) 153/83  Pulse: 60   Resp: 17   Temp: 98.5 F (36.9 C) 98.2 F (36.8 C)  SpO2:     Vitals:   01/10/24 2035 01/10/24 2310 01/11/24 0440 01/11/24 0901  BP: (!) 163/74 (!) 162/80 (!) 158/82 (!) 153/83  Pulse: 64 61 60   Resp: 18 16 17    Temp: 98.3 F (36.8 C) 98.3 F (36.8 C) 98.5 F (36.9 C) 98.2 F (36.8 C)  TempSrc: Oral Oral Oral Oral  SpO2:      Weight:      Height:        General: Pt is alert, awake, not in acute distress Cardiovascular: RRR, S1/S2 +, no rubs, no gallops Respiratory: CTA bilaterally, no wheezing, no rhonchi Abdominal: Soft, NT, ND, bowel sounds + Extremities: no edema, no cyanosis    The results of significant diagnostics from this hospitalization (including imaging, microbiology, ancillary and laboratory) are listed below for reference.     Microbiology: Recent Results (from the past 240 hours)  Resp panel by RT-PCR (RSV, Flu A&B, Covid) Anterior Nasal Swab     Status: None   Collection Time: 01/07/24  9:47 AM   Specimen: Anterior Nasal Swab  Result Value Ref Range Status   SARS Coronavirus 2 by RT PCR NEGATIVE NEGATIVE Final   Influenza A by PCR NEGATIVE NEGATIVE Final   Influenza B by PCR NEGATIVE NEGATIVE Final    Comment: (NOTE) The Xpert Xpress SARS-CoV-2/FLU/RSV plus assay is intended as an aid in the diagnosis of influenza from Nasopharyngeal swab specimens and should not be used as a sole basis for treatment. Nasal washings and aspirates are unacceptable for Xpert Xpress SARS-CoV-2/FLU/RSV testing.  Fact Sheet for Patients: bloggercourse.com  Fact Sheet for Healthcare Providers: seriousbroker.it  This test is not yet approved or cleared by the [place]  FDA and has  been authorized for detection and/or diagnosis of SARS-CoV-2 by FDA under an Emergency Use Authorization (EUA). This EUA will remain in effect (meaning this test can be used) for the duration of the COVID-19 declaration under Section 564(b)(1) of the Act, 21 U.S.C. section 360bbb-3(b)(1), unless the authorization is terminated or revoked.     Resp Syncytial Virus by PCR NEGATIVE NEGATIVE Final    Comment: (NOTE) Fact Sheet for Patients: bloggercourse.com  Fact Sheet for Healthcare Providers: seriousbroker.it  This test is not yet approved or cleared by the [place]  FDA and has been authorized for detection and/or diagnosis of SARS-CoV-2 by FDA under an Emergency Use Authorization (EUA). This EUA will remain in effect (meaning this test can be used) for the duration of the COVID-19 declaration under Section 564(b)(1) of the Act, 21 U.S.C. section 360bbb-3(b)(1), unless the authorization is terminated or revoked.  Performed at [facility], [address] [place], [place] [address]   Blood Culture (routine x 2)     Status: Abnormal   Collection Time: 01/07/24  9:47 AM   Specimen: BLOOD LEFT ARM  Result Value Ref Range Status   Specimen Description BLOOD LEFT ARM  Final   Special Requests   Final    BOTTLES DRAWN AEROBIC AND ANAEROBIC Blood Culture adequate volume   Culture  Setup Time   Final    GRAM NEGATIVE RODS  GRAM POSITIVE COCCI IN PAIRS IN CHAINS IN BOTH AEROBIC AND ANAEROBIC BOTTLES CRITICAL RESULT CALLED TO, READ BACK BY AND VERIFIED WITH: PHARMD J LEDFORD 01/08/2024 @ 0229 BY AB Performed at [facility], [address] [place], [place] [address]    Culture ESCHERICHIA COLI ENTEROCOCCUS FAECIUM  (A)  Final   Report Status 01/11/2024 FINAL  Final   Organism ID, Bacteria ESCHERICHIA COLI  Final   Organism ID, Bacteria ENTEROCOCCUS FAECIUM  Final      Susceptibility   Escherichia coli - MIC*     AMPICILLIN >=32 RESISTANT Resistant     CEFAZOLIN (NON-URINE) 4 INTERMEDIATE Intermediate     CEFEPIME <=0.12 SENSITIVE Sensitive     ERTAPENEM <=0.12 SENSITIVE Sensitive     CEFTRIAXONE <=0.25 SENSITIVE Sensitive     CIPROFLOXACIN <=0.06 SENSITIVE Sensitive     GENTAMICIN <=1 SENSITIVE Sensitive     MEROPENEM <=0.25 SENSITIVE Sensitive     TRIMETH/SULFA >=320 RESISTANT Resistant     AMPICILLIN/SULBACTAM 8 SENSITIVE Sensitive     PIP/TAZO Value in next row Sensitive      <=4 SENSITIVEThis is a modified FDA-approved test that has been validated and its performance characteristics determined by the reporting laboratory.  This laboratory is certified under the Clinical Laboratory Improvement Amendments CLIA as qualified to perform high complexity clinical laboratory testing.    * ESCHERICHIA COLI   Enterococcus faecium - MIC*    AMPICILLIN Value in next row Sensitive      <=4 SENSITIVEThis is a modified FDA-approved test that has been validated and its performance characteristics determined by the reporting laboratory.  This laboratory is certified under the Clinical Laboratory Improvement Amendments CLIA as qualified to perform high complexity clinical laboratory testing.    VANCOMYCIN Value in next row Sensitive      <=4 SENSITIVEThis is a modified FDA-approved test that has been validated and its performance characteristics determined by the reporting laboratory.  This laboratory is certified under the Clinical Laboratory Improvement Amendments CLIA as qualified to perform high complexity clinical laboratory testing.    GENTAMICIN SYNERGY Value in next row Sensitive      <=4 SENSITIVEThis is a modified FDA-approved test that has been validated and its performance characteristics determined by the reporting laboratory.  This laboratory is certified under the Clinical Laboratory Improvement Amendments CLIA as qualified to perform high complexity clinical laboratory testing.    * ENTEROCOCCUS  FAECIUM  Blood Culture ID Panel (Reflexed)     Status: Abnormal   Collection Time: 01/07/24  9:47 AM  Result Value Ref Range Status   Enterococcus faecalis NOT DETECTED NOT DETECTED Final   Enterococcus Faecium DETECTED (A) NOT DETECTED Final    Comment: CRITICAL RESULT CALLED TO, READ BACK BY AND VERIFIED WITH: PHARMD J LEDFORD 01/08/2024 @ 0229 BY AB    Listeria monocytogenes NOT DETECTED NOT DETECTED Final   Staphylococcus species NOT DETECTED NOT DETECTED Final   Staphylococcus aureus (BCID) NOT DETECTED NOT DETECTED Final   Staphylococcus epidermidis NOT DETECTED NOT DETECTED Final   Staphylococcus lugdunensis NOT DETECTED NOT DETECTED Final   Streptococcus species NOT DETECTED NOT DETECTED Final   Streptococcus agalactiae NOT DETECTED NOT DETECTED Final   Streptococcus pneumoniae NOT DETECTED NOT DETECTED Final   Streptococcus pyogenes NOT DETECTED NOT DETECTED Final   A.calcoaceticus-baumannii NOT DETECTED NOT DETECTED Final   Bacteroides fragilis NOT DETECTED NOT DETECTED Final   Enterobacterales DETECTED (A) NOT DETECTED Final    Comment: Enterobacterales represent a large order of  gram negative bacteria, not a single organism. CRITICAL RESULT CALLED TO, READ BACK BY AND VERIFIED WITH: PHARMD J LEDFORD 01/08/2024 @ 0229 BY AB    Enterobacter cloacae complex NOT DETECTED NOT DETECTED Final   Escherichia coli DETECTED (A) NOT DETECTED Final    Comment: CRITICAL RESULT CALLED TO, READ BACK BY AND VERIFIED WITH: PHARMD J LEDFORD 01/08/2024 @ 0229 BY AB    Klebsiella aerogenes NOT DETECTED NOT DETECTED Final   Klebsiella oxytoca NOT DETECTED NOT DETECTED Final   Klebsiella pneumoniae NOT DETECTED NOT DETECTED Final   Proteus species NOT DETECTED NOT DETECTED Final   Salmonella species NOT DETECTED NOT DETECTED Final   Serratia marcescens NOT DETECTED NOT DETECTED Final   Haemophilus influenzae NOT DETECTED NOT DETECTED Final   Neisseria meningitidis NOT DETECTED NOT DETECTED  Final   Pseudomonas aeruginosa NOT DETECTED NOT DETECTED Final   Stenotrophomonas maltophilia NOT DETECTED NOT DETECTED Final   Candida albicans NOT DETECTED NOT DETECTED Final   Candida auris NOT DETECTED NOT DETECTED Final   Candida glabrata NOT DETECTED NOT DETECTED Final   Candida krusei NOT DETECTED NOT DETECTED Final   Candida parapsilosis NOT DETECTED NOT DETECTED Final   Candida tropicalis NOT DETECTED NOT DETECTED Final   Cryptococcus neoformans/gattii NOT DETECTED NOT DETECTED Final   CTX-M ESBL NOT DETECTED NOT DETECTED Final   Carbapenem resistance IMP NOT DETECTED NOT DETECTED Final   Carbapenem resistance KPC NOT DETECTED NOT DETECTED Final   Carbapenem resistance NDM NOT DETECTED NOT DETECTED Final   Carbapenem resist OXA 48 LIKE NOT DETECTED NOT DETECTED Final   Vancomycin resistance NOT DETECTED NOT DETECTED Final   Carbapenem resistance VIM NOT DETECTED NOT DETECTED Final    Comment: Performed at [facility], [address] [place], [place] [address]  Blood Culture (routine x 2)     Status: Abnormal   Collection Time: 01/07/24  9:52 AM   Specimen: BLOOD RIGHT ARM  Result Value Ref Range Status   Specimen Description BLOOD RIGHT ARM  Final   Special Requests   Final    BOTTLES DRAWN AEROBIC AND ANAEROBIC Blood Culture adequate volume   Culture  Setup Time   Final    GRAM NEGATIVE RODS IN BOTH AEROBIC AND ANAEROBIC BOTTLES GRAM POSITIVE COCCI IN PAIRS IN CHAINS AEROBIC BOTTLE ONLY    Culture (A)  Final    ESCHERICHIA COLI ENTEROCOCCUS FAECIUM SUSCEPTIBILITIES PERFORMED ON PREVIOUS CULTURE WITHIN THE LAST 5 DAYS. Performed at [facility], [address], [place], [place] [address]    Report Status 01/11/2024 FINAL  Final  Respiratory (~20 pathogens) panel by PCR     Status: None   Collection Time: 01/07/24  8:46 PM  Result Value Ref Range Status   Adenovirus NOT DETECTED NOT DETECTED Final   Coronavirus 229E NOT DETECTED NOT DETECTED Final     Comment: (NOTE) The Coronavirus on the Respiratory Panel, DOES NOT test for the novel  Coronavirus (2019 nCoV)    Coronavirus HKU1 NOT DETECTED NOT DETECTED Final   Coronavirus NL63 NOT DETECTED NOT DETECTED Final   Coronavirus OC43 NOT DETECTED NOT DETECTED Final   Metapneumovirus NOT DETECTED NOT DETECTED Final   Rhinovirus / Enterovirus NOT DETECTED NOT DETECTED Final   Influenza A NOT DETECTED NOT DETECTED Final   Influenza B NOT DETECTED NOT DETECTED Final   Parainfluenza Virus 1 NOT DETECTED NOT DETECTED Final   Parainfluenza Virus 2 NOT DETECTED NOT DETECTED Final   Parainfluenza Virus  3 NOT DETECTED NOT DETECTED Final   Parainfluenza Virus 4 NOT DETECTED NOT DETECTED Final   Respiratory Syncytial Virus NOT DETECTED NOT DETECTED Final   Bordetella pertussis NOT DETECTED NOT DETECTED Final   Bordetella Parapertussis NOT DETECTED NOT DETECTED Final   Chlamydophila pneumoniae NOT DETECTED NOT DETECTED Final   Mycoplasma pneumoniae NOT DETECTED NOT DETECTED Final    Comment: Performed at [facility], [address] [place], [place] [address]  Culture, blood (Routine X 2) w Reflex to ID Panel     Status: None (Preliminary result)   Collection Time: 01/08/24  7:25 PM   Specimen: BLOOD  Result Value Ref Range Status   Specimen Description BLOOD SITE NOT SPECIFIED  Final   Special Requests   Final    BOTTLES DRAWN AEROBIC AND ANAEROBIC Blood Culture results may not be optimal due to an inadequate volume of blood received in culture bottles   Culture   Final    NO GROWTH 3 DAYS Performed at [facility], [address] [place], [place] [address]    Report Status PENDING  Incomplete  Culture, blood (Routine X 2) w Reflex to ID Panel     Status: None (Preliminary result)   Collection Time: 01/08/24  7:25 PM   Specimen: BLOOD  Result Value Ref Range Status   Specimen Description BLOOD SITE NOT SPECIFIED  Final   Special Requests   Final    BOTTLES DRAWN AEROBIC AND  ANAEROBIC Blood Culture results may not be optimal due to an inadequate volume of blood received in culture bottles   Culture   Final    NO GROWTH 3 DAYS Performed at [facility], [address] [place], [place] [address]    Report Status PENDING  Incomplete     Labs: BNP (last 3 results) No results for input(s): BNP in the last 8760 hours. Basic Metabolic Panel: Recent Labs  Lab 01/07/24 1020 01/07/24 1030 01/07/24 1031 01/08/24 0632 01/09/24 0437 01/10/24 0418  NA 138 138 136 138 136 138  K 3.1* 3.1* 3.1* 4.6 4.4 4.2  CL 101 98  --  106 108 104  CO2 22  --   --  23 21* 24  GLUCOSE 90 96  --  75 146* 103*  BUN 12 13  --  20 21 15   CREATININE 0.97 1.00  --  1.07* 0.92 0.86  CALCIUM 9.4  --   --  7.8* 7.8* 7.8*  MG  --   --   --   --  2.1 2.3  PHOS  --   --   --   --  2.3* 1.6*   Liver Function Tests: Recent Labs  Lab 01/07/24 1020 01/08/24 0632 01/09/24 0437 01/10/24 0418  AST 135* 77* 39 21  ALT 77* 53* 39 27  ALKPHOS 137* 98 110 108  BILITOT 7.5* 6.8* 3.1* 1.9*  PROT 6.3* 5.3* 5.7* 5.5*  ALBUMIN 3.4* 2.6* 2.6* 2.5*   Recent Labs  Lab 01/08/24 1015  LIPASE 20   Recent Labs  Lab 01/07/24 1020  AMMONIA 39*   CBC: Recent Labs  Lab 01/07/24 1020 01/07/24 1030 01/07/24 1031 01/08/24 0632 01/09/24 0437 01/10/24 0418  WBC 13.9*  --   --  16.0* 9.8 13.6*  NEUTROABS 12.8*  --   --   --  8.0* 11.0*  HGB 15.6* 15.6* 16.0* 13.6 13.0 12.5  HCT 46.0 46.0 47.0* 40.3 38.2 37.0  MCV 84.4  --   --  86.1 84.3 84.9  PLT 184  --   --  124* 117* 169   Cardiac Enzymes: No results for input(s): CKTOTAL, CKMB, CKMBINDEX, TROPONINI in the last 168 hours. BNP: Invalid input(s): POCBNP CBG: No results for input(s): GLUCAP in the last 168 hours. D-Dimer No results for input(s): DDIMER in the last 72 hours. Hgb A1c Recent Labs    01/09/24 0437  HGBA1C 5.9*   Lipid Profile No results for input(s): CHOL, HDL, LDLCALC, TRIG,  CHOLHDL, LDLDIRECT in the last 72 hours. Thyroid  function studies No results for input(s): TSH, T4TOTAL, T3FREE, THYROIDAB in the last 72 hours.  Invalid input(s): FREET3 Anemia work up No results for input(s): VITAMINB12, FOLATE, FERRITIN, TIBC, IRON, RETICCTPCT in the last 72 hours. Urinalysis    Component Value Date/Time   COLORURINE ORANGE (A) 01/07/2024 0948   APPEARANCEUR CLEAR 01/07/2024 0948   LABSPEC 1.015 01/07/2024 0948   PHURINE 8.0 01/07/2024 0948   GLUCOSEU 100 (A) 01/07/2024 0948   HGBUR TRACE (A) 01/07/2024 0948   BILIRUBINUR MODERATE (A) 01/07/2024 0948   KETONESUR NEGATIVE 01/07/2024 0948   PROTEINUR 30 (A) 01/07/2024 0948   NITRITE NEGATIVE 01/07/2024 0948   LEUKOCYTESUR MODERATE (A) 01/07/2024 0948   Sepsis Labs Recent Labs  Lab 01/07/24 1020 01/08/24 0632 01/09/24 0437 01/10/24 0418  WBC 13.9* 16.0* 9.8 13.6*   Microbiology Recent Results (from the past 240 hours)  Resp panel by RT-PCR (RSV, Flu A&B, Covid) Anterior Nasal Swab     Status: None   Collection Time: 01/07/24  9:47 AM   Specimen: Anterior Nasal Swab  Result Value Ref Range Status   SARS Coronavirus 2 by RT PCR NEGATIVE NEGATIVE Final   Influenza A by PCR NEGATIVE NEGATIVE Final   Influenza B by PCR NEGATIVE NEGATIVE Final    Comment: (NOTE) The Xpert Xpress SARS-CoV-2/FLU/RSV plus assay is intended as an aid in the diagnosis of influenza from Nasopharyngeal swab specimens and should not be used as a sole basis for treatment. Nasal washings and aspirates are unacceptable for Xpert Xpress SARS-CoV-2/FLU/RSV testing.  Fact Sheet for Patients: bloggercourse.com  Fact Sheet for Healthcare Providers: seriousbroker.it  This test is not yet approved or cleared by the [place]  FDA and has been authorized for detection and/or diagnosis of SARS-CoV-2 by FDA under an Emergency Use Authorization (EUA). This EUA  will remain in effect (meaning this test can be used) for the duration of the COVID-19 declaration under Section 564(b)(1) of the Act, 21 U.S.C. section 360bbb-3(b)(1), unless the authorization is terminated or revoked.     Resp Syncytial Virus by PCR NEGATIVE NEGATIVE Final    Comment: (NOTE) Fact Sheet for Patients: bloggercourse.com  Fact Sheet for Healthcare Providers: seriousbroker.it  This test is not yet approved or cleared by the [place]  FDA and has been authorized for detection and/or diagnosis of SARS-CoV-2 by FDA under an Emergency Use Authorization (EUA). This EUA will remain in effect (meaning this test can be used) for the duration of the COVID-19 declaration under Section 564(b)(1) of the Act, 21 U.S.C. section 360bbb-3(b)(1), unless the authorization is terminated or revoked.  Performed at [facility], [address], [place], [place] [address]   Blood Culture (routine x 2)     Status: Abnormal   Collection Time: 01/07/24  9:47 AM   Specimen: BLOOD LEFT ARM  Result Value Ref Range Status   Specimen Description BLOOD LEFT ARM  Final   Special Requests   Final    BOTTLES DRAWN  AEROBIC AND ANAEROBIC Blood Culture adequate volume   Culture  Setup Time   Final    GRAM NEGATIVE RODS GRAM POSITIVE COCCI IN PAIRS IN CHAINS IN BOTH AEROBIC AND ANAEROBIC BOTTLES CRITICAL RESULT CALLED TO, READ BACK BY AND VERIFIED WITH: PHARMD J LEDFORD 01/08/2024 @ 0229 BY AB Performed at [facility], [address], [place], [place] [address]    Culture ESCHERICHIA COLI ENTEROCOCCUS FAECIUM  (A)  Final   Report Status 01/11/2024 FINAL  Final   Organism ID, Bacteria ESCHERICHIA COLI  Final   Organism ID, Bacteria ENTEROCOCCUS FAECIUM  Final      Susceptibility   Escherichia coli - MIC*    AMPICILLIN >=32 RESISTANT Resistant     CEFAZOLIN (NON-URINE) 4 INTERMEDIATE Intermediate     CEFEPIME <=0.12 SENSITIVE  Sensitive     ERTAPENEM <=0.12 SENSITIVE Sensitive     CEFTRIAXONE <=0.25 SENSITIVE Sensitive     CIPROFLOXACIN <=0.06 SENSITIVE Sensitive     GENTAMICIN <=1 SENSITIVE Sensitive     MEROPENEM <=0.25 SENSITIVE Sensitive     TRIMETH/SULFA >=320 RESISTANT Resistant     AMPICILLIN/SULBACTAM 8 SENSITIVE Sensitive     PIP/TAZO Value in next row Sensitive      <=4 SENSITIVEThis is a modified FDA-approved test that has been validated and its performance characteristics determined by the reporting laboratory.  This laboratory is certified under the Clinical Laboratory Improvement Amendments CLIA as qualified to perform high complexity clinical laboratory testing.    * ESCHERICHIA COLI   Enterococcus faecium - MIC*    AMPICILLIN Value in next row Sensitive      <=4 SENSITIVEThis is a modified FDA-approved test that has been validated and its performance characteristics determined by the reporting laboratory.  This laboratory is certified under the Clinical Laboratory Improvement Amendments CLIA as qualified to perform high complexity clinical laboratory testing.    VANCOMYCIN Value in next row Sensitive      <=4 SENSITIVEThis is a modified FDA-approved test that has been validated and its performance characteristics determined by the reporting laboratory.  This laboratory is certified under the Clinical Laboratory Improvement Amendments CLIA as qualified to perform high complexity clinical laboratory testing.    GENTAMICIN SYNERGY Value in next row Sensitive      <=4 SENSITIVEThis is a modified FDA-approved test that has been validated and its performance characteristics determined by the reporting laboratory.  This laboratory is certified under the Clinical Laboratory Improvement Amendments CLIA as qualified to perform high complexity clinical laboratory testing.    * ENTEROCOCCUS FAECIUM  Blood Culture ID Panel (Reflexed)     Status: Abnormal   Collection Time: 01/07/24  9:47 AM  Result Value Ref Range  Status   Enterococcus faecalis NOT DETECTED NOT DETECTED Final   Enterococcus Faecium DETECTED (A) NOT DETECTED Final    Comment: CRITICAL RESULT CALLED TO, READ BACK BY AND VERIFIED WITH: PHARMD J LEDFORD 01/08/2024 @ 0229 BY AB    Listeria monocytogenes NOT DETECTED NOT DETECTED Final   Staphylococcus species NOT DETECTED NOT DETECTED Final   Staphylococcus aureus (BCID) NOT DETECTED NOT DETECTED Final   Staphylococcus epidermidis NOT DETECTED NOT DETECTED Final   Staphylococcus lugdunensis NOT DETECTED NOT DETECTED Final   Streptococcus species NOT DETECTED NOT DETECTED Final   Streptococcus agalactiae NOT DETECTED NOT DETECTED Final   Streptococcus pneumoniae NOT DETECTED NOT DETECTED Final   Streptococcus pyogenes NOT DETECTED NOT DETECTED Final   A.calcoaceticus-baumannii NOT DETECTED NOT DETECTED Final   Bacteroides fragilis NOT  DETECTED NOT DETECTED Final   Enterobacterales DETECTED (A) NOT DETECTED Final    Comment: Enterobacterales represent a large order of gram negative bacteria, not a single organism. CRITICAL RESULT CALLED TO, READ BACK BY AND VERIFIED WITH: PHARMD J LEDFORD 01/08/2024 @ 0229 BY AB    Enterobacter cloacae complex NOT DETECTED NOT DETECTED Final   Escherichia coli DETECTED (A) NOT DETECTED Final    Comment: CRITICAL RESULT CALLED TO, READ BACK BY AND VERIFIED WITH: PHARMD J LEDFORD 01/08/2024 @ 0229 BY AB    Klebsiella aerogenes NOT DETECTED NOT DETECTED Final   Klebsiella oxytoca NOT DETECTED NOT DETECTED Final   Klebsiella pneumoniae NOT DETECTED NOT DETECTED Final   Proteus species NOT DETECTED NOT DETECTED Final   Salmonella species NOT DETECTED NOT DETECTED Final   Serratia marcescens NOT DETECTED NOT DETECTED Final   Haemophilus influenzae NOT DETECTED NOT DETECTED Final   Neisseria meningitidis NOT DETECTED NOT DETECTED Final   Pseudomonas aeruginosa NOT DETECTED NOT DETECTED Final   Stenotrophomonas maltophilia NOT DETECTED NOT DETECTED Final    Candida albicans NOT DETECTED NOT DETECTED Final   Candida auris NOT DETECTED NOT DETECTED Final   Candida glabrata NOT DETECTED NOT DETECTED Final   Candida krusei NOT DETECTED NOT DETECTED Final   Candida parapsilosis NOT DETECTED NOT DETECTED Final   Candida tropicalis NOT DETECTED NOT DETECTED Final   Cryptococcus neoformans/gattii NOT DETECTED NOT DETECTED Final   CTX-M ESBL NOT DETECTED NOT DETECTED Final   Carbapenem resistance IMP NOT DETECTED NOT DETECTED Final   Carbapenem resistance KPC NOT DETECTED NOT DETECTED Final   Carbapenem resistance NDM NOT DETECTED NOT DETECTED Final   Carbapenem resist OXA 48 LIKE NOT DETECTED NOT DETECTED Final   Vancomycin resistance NOT DETECTED NOT DETECTED Final   Carbapenem resistance VIM NOT DETECTED NOT DETECTED Final    Comment: Performed at [facility], [address] [place], [place] [address]  Blood Culture (routine x 2)     Status: Abnormal   Collection Time: 01/07/24  9:52 AM   Specimen: BLOOD RIGHT ARM  Result Value Ref Range Status   Specimen Description BLOOD RIGHT ARM  Final   Special Requests   Final    BOTTLES DRAWN AEROBIC AND ANAEROBIC Blood Culture adequate volume   Culture  Setup Time   Final    GRAM NEGATIVE RODS IN BOTH AEROBIC AND ANAEROBIC BOTTLES GRAM POSITIVE COCCI IN PAIRS IN CHAINS AEROBIC BOTTLE ONLY    Culture (A)  Final    ESCHERICHIA COLI ENTEROCOCCUS FAECIUM SUSCEPTIBILITIES PERFORMED ON PREVIOUS CULTURE WITHIN THE LAST 5 DAYS. Performed at [facility], [address], [place], [place] [address]    Report Status 01/11/2024 FINAL  Final  Respiratory (~20 pathogens) panel by PCR     Status: None   Collection Time: 01/07/24  8:46 PM  Result Value Ref Range Status   Adenovirus NOT DETECTED NOT DETECTED Final   Coronavirus 229E NOT DETECTED NOT DETECTED Final    Comment: (NOTE) The Coronavirus on the Respiratory Panel, DOES NOT test for the novel  Coronavirus (2019 nCoV)    Coronavirus  HKU1 NOT DETECTED NOT DETECTED Final   Coronavirus NL63 NOT DETECTED NOT DETECTED Final   Coronavirus OC43 NOT DETECTED NOT DETECTED Final   Metapneumovirus NOT DETECTED NOT DETECTED Final   Rhinovirus / Enterovirus NOT DETECTED NOT DETECTED Final   Influenza A NOT DETECTED NOT DETECTED Final   Influenza B NOT DETECTED NOT DETECTED Final  Parainfluenza Virus 1 NOT DETECTED NOT DETECTED Final   Parainfluenza Virus 2 NOT DETECTED NOT DETECTED Final   Parainfluenza Virus 3 NOT DETECTED NOT DETECTED Final   Parainfluenza Virus 4 NOT DETECTED NOT DETECTED Final   Respiratory Syncytial Virus NOT DETECTED NOT DETECTED Final   Bordetella pertussis NOT DETECTED NOT DETECTED Final   Bordetella Parapertussis NOT DETECTED NOT DETECTED Final   Chlamydophila pneumoniae NOT DETECTED NOT DETECTED Final   Mycoplasma pneumoniae NOT DETECTED NOT DETECTED Final    Comment: Performed at [facility], [address] [place], [place] [address]  Culture, blood (Routine X 2) w Reflex to ID Panel     Status: None (Preliminary result)   Collection Time: 01/08/24  7:25 PM   Specimen: BLOOD  Result Value Ref Range Status   Specimen Description BLOOD SITE NOT SPECIFIED  Final   Special Requests   Final    BOTTLES DRAWN AEROBIC AND ANAEROBIC Blood Culture results may not be optimal due to an inadequate volume of blood received in culture bottles   Culture   Final    NO GROWTH 3 DAYS Performed at [facility], [address] [place], [place] [address]    Report Status PENDING  Incomplete  Culture, blood (Routine X 2) w Reflex to ID Panel     Status: None (Preliminary result)   Collection Time: 01/08/24  7:25 PM   Specimen: BLOOD  Result Value Ref Range Status   Specimen Description BLOOD SITE NOT SPECIFIED  Final   Special Requests   Final    BOTTLES DRAWN AEROBIC AND ANAEROBIC Blood Culture results may not be optimal due to an inadequate volume of blood received in culture bottles   Culture    Final    NO GROWTH 3 DAYS Performed at [facility], [address], [place], [place] [address]    Report Status PENDING  Incomplete     Time coordinating discharge: 35 minutes  SIGNED:   Renato Applebaum, MD  [facility] 01/11/2024, 10:22 AM

## 2024-01-11 NOTE — Progress Notes (Addendum)
 [facility] Infectious Diseases Follow Up Note  Patient Identification: Patient Name: Molly Myers MRN: [id number] Admit Date: 01/07/2024  9:17 AM Age: 73 y.o.Today's Date: 01/11/2024  Reason for Visit: Polymicrobial bacteremia  Principal Problem:   Polymicrobial bacteremia Active Problems:   Cholangitis (HCC)   Abnormal LFTs   Choledocholithiasis   Bacteremia due to Gram-negative bacteria   Abdominal pain, RUQ   Erosive gastritis   Sepsis (HCC)   Community acquired pneumonia of right lower lobe of lung   Antibiotics: IV vancomycin 11/30- IV Zosyn 11/30-12/2, IV ceftriaxone 12/3-  Lines/Hardwares: GI stent   Interval Events: Remains afebrile Labs remarkable for albumin 2.5, WBC elevated to 13.6 Sensitivities reviewed HCV RNA negative   Assessment 73 year old female with prior history of cholecystectomy approximately 30 years ago, hypothyroidism, chronic back pain who initially presented with acute onset confusion, fever, shortness of breath.  Found to have  # Polymicrobial bacteremia with E faecium, E. coli 2/2 # Choledocholithiasis/purulent cholangitis - S/p ERCP with biliary sphincterotomy, sphincteroplasty with purulence noted, biliary stent was placed, plan to remove stent in 2 months per GI - 12/2 TTE aortic calcification versus vegetation - 12/3 TEE no evidence of vegetation or endocarditis - 12/1 repeat blood culture no growth in 3 days - Clinically improving  Recommendations - Will switch IV antibiotics to p.o. ciprofloxacin and amoxicillin to complete 3 weeks course through 12/22, a little longer course than 10-14 days due to purulent cholangitis. EOT 12/22. Avoiding Augmentin in the setting of higher MIC 8 for E. Coli per discussion with ID pharmacy.  QTc 432.  No significant ddI's noted - She needs to follow-up with GI - She has a follow-up scheduled with Dr. Luiz  on 12/12 at 9 am  - Universal/standard  isolation precautions D/W primary team ID will sign off  Rest of the management as per the primary team. Thank you for the consult. Please page with pertinent questions or concerns.  ______________________________________________________________________ Subjective patient seen and examined at the bedside.  Clinically improving, eager to go home.  No complaints.  Discussed plan about antibiotics as well as follow-up.   Past Medical History:  Diagnosis Date   Asthma    Hypothyroidism    Past Surgical History:  Procedure Laterality Date   BREAST REDUCTION SURGERY Bilateral benign tumors   CHOLECYSTECTOMY  in the 80s   ERCP N/A 01/08/2024   Procedure: ERCP, WITH INTERVENTION IF INDICATED;  Surgeon: Mansouraty, Aloha Raddle., MD;  Location: [facility];  Service: Gastroenterology;  Laterality: N/A;  Day trip from [place] to [place] today 01/08/2024   Vitals BP (!) 158/82 (BP Location: Left Arm)   Pulse 60   Temp 98.5 F (36.9 C) (Oral)   Resp 17   Ht 5' 3 (1.6 m)   Wt 58.1 kg   SpO2 98%   BMI 22.69 kg/m     Physical Exam Constitutional: Elderly female lying in the bed    Comments:   Cardiovascular:     Rate and Rhythm: Normal rate     Heart sounds:   Pulmonary:     Effort: Pulmonary effort is normal.     Comments:   Abdominal:     Palpations: Abdomen is nondistended and soft    Tenderness:   Musculoskeletal:        General: No swelling or tenderness in peripheral joints  Skin:    Comments: No rashes  Neurological:     General: Awake, alert and oriented, grossly nonfocal  Psychiatric:  Mood and Affect: Mood normal.   Pathology  01/08/24 FINAL MICROSCOPIC DIAGNOSIS:   A. RANDOM GASTRIC BIOPSIES:  Chronic gastritis with lymphoid aggregates  Helicobacter stain negative (IHC, adequate control)  Negative for intestinal metaplasia, dysplasia and carcinoma   Pertinent Microbiology Results for orders placed or performed during the hospital  encounter of 01/07/24  Resp panel by RT-PCR (RSV, Flu A&B, Covid) Anterior Nasal Swab     Status: None   Collection Time: 01/07/24  9:47 AM   Specimen: Anterior Nasal Swab  Result Value Ref Range Status   SARS Coronavirus 2 by RT PCR NEGATIVE NEGATIVE Final   Influenza A by PCR NEGATIVE NEGATIVE Final   Influenza B by PCR NEGATIVE NEGATIVE Final    Comment: (NOTE) The Xpert Xpress SARS-CoV-2/FLU/RSV plus assay is intended as an aid in the diagnosis of influenza from Nasopharyngeal swab specimens and should not be used as a sole basis for treatment. Nasal washings and aspirates are unacceptable for Xpert Xpress SARS-CoV-2/FLU/RSV testing.  Fact Sheet for Patients: bloggercourse.com  Fact Sheet for Healthcare Providers: seriousbroker.it  This test is not yet approved or cleared by the [place]  FDA and has been authorized for detection and/or diagnosis of SARS-CoV-2 by FDA under an Emergency Use Authorization (EUA). This EUA will remain in effect (meaning this test can be used) for the duration of the COVID-19 declaration under Section 564(b)(1) of the Act, 21 U.S.C. section 360bbb-3(b)(1), unless the authorization is terminated or revoked.     Resp Syncytial Virus by PCR NEGATIVE NEGATIVE Final    Comment: (NOTE) Fact Sheet for Patients: bloggercourse.com  Fact Sheet for Healthcare Providers: seriousbroker.it  This test is not yet approved or cleared by the [place]  FDA and has been authorized for detection and/or diagnosis of SARS-CoV-2 by FDA under an Emergency Use Authorization (EUA). This EUA will remain in effect (meaning this test can be used) for the duration of the COVID-19 declaration under Section 564(b)(1) of the Act, 21 U.S.C. section 360bbb-3(b)(1), unless the authorization is terminated or revoked.  Performed at [facility], [address],  [place], [place] [address]   Blood Culture (routine x 2)     Status: Abnormal (Preliminary result)   Collection Time: 01/07/24  9:47 AM   Specimen: BLOOD LEFT ARM  Result Value Ref Range Status   Specimen Description BLOOD LEFT ARM  Final   Special Requests   Final    BOTTLES DRAWN AEROBIC AND ANAEROBIC Blood Culture adequate volume   Culture  Setup Time   Final    GRAM NEGATIVE RODS GRAM POSITIVE COCCI IN PAIRS IN CHAINS IN BOTH AEROBIC AND ANAEROBIC BOTTLES CRITICAL RESULT CALLED TO, READ BACK BY AND VERIFIED WITH: PHARMD J LEDFORD 01/08/2024 @ 0229 BY AB    Culture (A)  Final    ESCHERICHIA COLI ENTEROCOCCUS FAECIUM SUSCEPTIBILITIES TO FOLLOW    Report Status PENDING  Incomplete   Organism ID, Bacteria ESCHERICHIA COLI  Final      Susceptibility   Escherichia coli - MIC*    AMPICILLIN >=32 RESISTANT Resistant     CEFAZOLIN (NON-URINE) 4 INTERMEDIATE Intermediate     CEFEPIME <=0.12 SENSITIVE Sensitive     ERTAPENEM <=0.12 SENSITIVE Sensitive     CEFTRIAXONE <=0.25 SENSITIVE Sensitive     CIPROFLOXACIN <=0.06 SENSITIVE Sensitive     GENTAMICIN <=1 SENSITIVE Sensitive     MEROPENEM <=0.25 SENSITIVE Sensitive     TRIMETH/SULFA >=320 RESISTANT Resistant     AMPICILLIN/SULBACTAM 8 SENSITIVE Sensitive  PIP/TAZO Value in next row Sensitive      <=4 SENSITIVEThis is a modified FDA-approved test that has been validated and its performance characteristics determined by the reporting laboratory.  This laboratory is certified under the Clinical Laboratory Improvement Amendments CLIA as qualified to perform high complexity clinical laboratory testing.Performed at [facility], [address], [place], [place] [address]    * ESCHERICHIA COLI  Blood Culture ID Panel (Reflexed)     Status: Abnormal   Collection Time: 01/07/24  9:47 AM  Result Value Ref Range Status   Enterococcus faecalis NOT DETECTED NOT DETECTED Final   Enterococcus Faecium DETECTED (A) NOT DETECTED Final     Comment: CRITICAL RESULT CALLED TO, READ BACK BY AND VERIFIED WITH: PHARMD J LEDFORD 01/08/2024 @ 0229 BY AB    Listeria monocytogenes NOT DETECTED NOT DETECTED Final   Staphylococcus species NOT DETECTED NOT DETECTED Final   Staphylococcus aureus (BCID) NOT DETECTED NOT DETECTED Final   Staphylococcus epidermidis NOT DETECTED NOT DETECTED Final   Staphylococcus lugdunensis NOT DETECTED NOT DETECTED Final   Streptococcus species NOT DETECTED NOT DETECTED Final   Streptococcus agalactiae NOT DETECTED NOT DETECTED Final   Streptococcus pneumoniae NOT DETECTED NOT DETECTED Final   Streptococcus pyogenes NOT DETECTED NOT DETECTED Final   A.calcoaceticus-baumannii NOT DETECTED NOT DETECTED Final   Bacteroides fragilis NOT DETECTED NOT DETECTED Final   Enterobacterales DETECTED (A) NOT DETECTED Final    Comment: Enterobacterales represent a large order of gram negative bacteria, not a single organism. CRITICAL RESULT CALLED TO, READ BACK BY AND VERIFIED WITH: PHARMD J LEDFORD 01/08/2024 @ 0229 BY AB    Enterobacter cloacae complex NOT DETECTED NOT DETECTED Final   Escherichia coli DETECTED (A) NOT DETECTED Final    Comment: CRITICAL RESULT CALLED TO, READ BACK BY AND VERIFIED WITH: PHARMD J LEDFORD 01/08/2024 @ 0229 BY AB    Klebsiella aerogenes NOT DETECTED NOT DETECTED Final   Klebsiella oxytoca NOT DETECTED NOT DETECTED Final   Klebsiella pneumoniae NOT DETECTED NOT DETECTED Final   Proteus species NOT DETECTED NOT DETECTED Final   Salmonella species NOT DETECTED NOT DETECTED Final   Serratia marcescens NOT DETECTED NOT DETECTED Final   Haemophilus influenzae NOT DETECTED NOT DETECTED Final   Neisseria meningitidis NOT DETECTED NOT DETECTED Final   Pseudomonas aeruginosa NOT DETECTED NOT DETECTED Final   Stenotrophomonas maltophilia NOT DETECTED NOT DETECTED Final   Candida albicans NOT DETECTED NOT DETECTED Final   Candida auris NOT DETECTED NOT DETECTED Final   Candida glabrata NOT  DETECTED NOT DETECTED Final   Candida krusei NOT DETECTED NOT DETECTED Final   Candida parapsilosis NOT DETECTED NOT DETECTED Final   Candida tropicalis NOT DETECTED NOT DETECTED Final   Cryptococcus neoformans/gattii NOT DETECTED NOT DETECTED Final   CTX-M ESBL NOT DETECTED NOT DETECTED Final   Carbapenem resistance IMP NOT DETECTED NOT DETECTED Final   Carbapenem resistance KPC NOT DETECTED NOT DETECTED Final   Carbapenem resistance NDM NOT DETECTED NOT DETECTED Final   Carbapenem resist OXA 48 LIKE NOT DETECTED NOT DETECTED Final   Vancomycin resistance NOT DETECTED NOT DETECTED Final   Carbapenem resistance VIM NOT DETECTED NOT DETECTED Final    Comment: Performed at [facility], [address], [place], [place] [address]  Blood Culture (routine x 2)     Status: Abnormal (Preliminary result)   Collection Time: 01/07/24  9:52 AM   Specimen: BLOOD RIGHT ARM  Result Value Ref Range Status  Specimen Description BLOOD RIGHT ARM  Final   Special Requests   Final    BOTTLES DRAWN AEROBIC AND ANAEROBIC Blood Culture adequate volume   Culture  Setup Time   Final    GRAM NEGATIVE RODS IN BOTH AEROBIC AND ANAEROBIC BOTTLES GRAM POSITIVE COCCI IN PAIRS IN CHAINS AEROBIC BOTTLE ONLY Performed at [facility], [address], [place], [place] [address]    Culture ESCHERICHIA COLI ENTEROCOCCUS FAECIUM  (A)  Final   Report Status PENDING  Incomplete  Respiratory (~20 pathogens) panel by PCR     Status: None   Collection Time: 01/07/24  8:46 PM  Result Value Ref Range Status   Adenovirus NOT DETECTED NOT DETECTED Final   Coronavirus 229E NOT DETECTED NOT DETECTED Final    Comment: (NOTE) The Coronavirus on the Respiratory Panel, DOES NOT test for the novel  Coronavirus (2019 nCoV)    Coronavirus HKU1 NOT DETECTED NOT DETECTED Final   Coronavirus NL63 NOT DETECTED NOT DETECTED Final   Coronavirus OC43 NOT DETECTED NOT DETECTED Final   Metapneumovirus NOT DETECTED NOT  DETECTED Final   Rhinovirus / Enterovirus NOT DETECTED NOT DETECTED Final   Influenza A NOT DETECTED NOT DETECTED Final   Influenza B NOT DETECTED NOT DETECTED Final   Parainfluenza Virus 1 NOT DETECTED NOT DETECTED Final   Parainfluenza Virus 2 NOT DETECTED NOT DETECTED Final   Parainfluenza Virus 3 NOT DETECTED NOT DETECTED Final   Parainfluenza Virus 4 NOT DETECTED NOT DETECTED Final   Respiratory Syncytial Virus NOT DETECTED NOT DETECTED Final   Bordetella pertussis NOT DETECTED NOT DETECTED Final   Bordetella Parapertussis NOT DETECTED NOT DETECTED Final   Chlamydophila pneumoniae NOT DETECTED NOT DETECTED Final   Mycoplasma pneumoniae NOT DETECTED NOT DETECTED Final    Comment: Performed at [facility], [address] [place], [place] [address]  Culture, blood (Routine X 2) w Reflex to ID Panel     Status: None (Preliminary result)   Collection Time: 01/08/24  7:25 PM   Specimen: BLOOD  Result Value Ref Range Status   Specimen Description BLOOD SITE NOT SPECIFIED  Final   Special Requests   Final    BOTTLES DRAWN AEROBIC AND ANAEROBIC Blood Culture results may not be optimal due to an inadequate volume of blood received in culture bottles   Culture   Final    NO GROWTH 2 DAYS Performed at [facility], [address], [place], [place] [address]    Report Status PENDING  Incomplete  Culture, blood (Routine X 2) w Reflex to ID Panel     Status: None (Preliminary result)   Collection Time: 01/08/24  7:25 PM   Specimen: BLOOD  Result Value Ref Range Status   Specimen Description BLOOD SITE NOT SPECIFIED  Final   Special Requests   Final    BOTTLES DRAWN AEROBIC AND ANAEROBIC Blood Culture results may not be optimal due to an inadequate volume of blood received in culture bottles   Culture   Final    NO GROWTH 2 DAYS Performed at [facility], [address], [place], [place] [address]    Report Status PENDING  Incomplete   Pertinent Lab.    Latest Ref  Rng & Units 01/10/2024    4:18 AM 01/09/2024    4:37 AM 01/08/2024    6:32 AM  CBC  WBC 4.0 - 10.5 K/uL 13.6  9.8  16.0   Hemoglobin 12.0 - 15.0  g/dL 87.4  86.9  86.3   Hematocrit 36.0 - 46.0 % 37.0  38.2  40.3   Platelets 150 - 400 K/uL 169  117  124       Latest Ref Rng & Units 01/10/2024    4:18 AM 01/09/2024    4:37 AM 01/08/2024    6:32 AM  CMP  Glucose 70 - 99 mg/dL 896  853  75   BUN 8 - 23 mg/dL 15  21  20    Creatinine 0.44 - 1.00 mg/dL 9.13  9.07  8.92   Sodium 135 - 145 mmol/L 138  136  138   Potassium 3.5 - 5.1 mmol/L 4.2  4.4  4.6   Chloride 98 - 111 mmol/L 104  108  106   CO2 22 - 32 mmol/L 24  21  23    Calcium 8.9 - 10.3 mg/dL 7.8  7.8  7.8   Total Protein 6.5 - 8.1 g/dL 5.5  5.7  5.3   Total Bilirubin 0.0 - 1.2 mg/dL 1.9  3.1  6.8   Alkaline Phos 38 - 126 U/L 108  110  98   AST 15 - 41 U/L 21  39  77   ALT 0 - 44 U/L 27  39  53      Pertinent Imaging today Plain films and CT images have been personally visualized and interpreted; radiology reports have been reviewed. Decision making incorporated into the Impression /   ECHO TEE Result Date: 01/10/2024    TRANSESOPHOGEAL ECHO REPORT   Patient Name:   ZAILEY AUDIA Date of Exam: 01/10/2024 Medical Rec #:  [id number]      Height:       63.0 in Accession #:    [id number]     Weight:       128.1 lb Date of Birth:  1950-03-11       BSA:          1.600 m Patient Age:    73 years       BP:           159/85 mmHg Patient Gender: F              HR:           79 bpm. Exam Location:  Inpatient Procedure: Transesophageal Echo, 3D Echo, Limited Color Doppler and Cardiac            Doppler (Both Spectral and Color Flow Doppler were utilized during            procedure). Indications:     Endocarditis  History:         Patient has prior history of Echocardiogram examinations, most                  recent 01/09/2024. Signs/Symptoms:Bacteremia; Risk                  Factors:Current Smoker.  Sonographer:     Koleen Popper RDCS Referring Phys:   [id number] ZANE ADAMS Diagnosing Phys: Kardie Tobb DO PROCEDURE: After discussion of the risks and benefits of a TEE, an informed consent was obtained from the patient. The transesophogeal probe was passed without difficulty through the esophogus of the patient. Imaged were obtained with the patient in a left lateral decubitus position. Sedation performed by different physician. The patient was monitored while under deep sedation. Anesthestetic sedation was provided intravenously by Anesthesiology: 220mg  of Propofol , 100mg  of Lidocaine . Image quality was  good. The patient's  vital signs; including heart rate, blood pressure, and oxygen saturation; remained stable throughout the procedure. The patient developed no complications during the procedure.  IMPRESSIONS  1. Left ventricular ejection fraction, by estimation, is 55 to 60%. The left ventricle has normal function.  2. Right ventricular systolic function is normal. The right ventricular size is normal.  3. No left atrial/left atrial appendage thrombus was detected. The LAA emptying velocity was 44 cm/s.  4. The mitral valve is abnormal. Mild to moderate mitral valve regurgitation.  5. The aortic valve is normal in structure. There is mild calcification of the aortic valve. There is mild thickening of the aortic valve. Aortic valve regurgitation is trivial. Aortic valve sclerosis/calcification is present, without any evidence of aortic stenosis.  6. 3D performed of the aortic valve. Conclusion(s)/Recommendation(s): Normal biventricular function without evidence of hemodynamically significant valvular heart disease. FINDINGS  Left Ventricle: Left ventricular ejection fraction, by estimation, is 55 to 60%. The left ventricle has normal function. The left ventricular internal cavity size was normal in size. Right Ventricle: The right ventricular size is normal. No increase in right ventricular wall thickness. Right ventricular systolic function is normal. Left Atrium:  Left atrial size was normal in size. No left atrial/left atrial appendage thrombus was detected. The LAA emptying velocity was 44 cm/s. Right Atrium: Right atrial size was not well visualized. Pericardium: There is no evidence of pericardial effusion. Mitral Valve: The mitral valve is abnormal. Mild to moderate mitral valve regurgitation. Tricuspid Valve: The tricuspid valve is normal in structure. Aortic Valve: The aortic valve is normal in structure. There is mild calcification of the aortic valve. There is mild thickening of the aortic valve. Aortic valve regurgitation is trivial. Aortic valve sclerosis/calcification is present, without any evidence of aortic stenosis. Pulmonic Valve: The pulmonic valve was not well visualized. Pulmonic valve regurgitation is trivial. Aorta: The aortic root and ascending aorta are structurally normal, with no evidence of dilitation. Venous: The left upper pulmonary vein, left lower pulmonary vein, right upper pulmonary vein and right lower pulmonary vein are normal. A normal flow pattern is recorded from the left upper pulmonary vein. IAS/Shunts: The interatrial septum was not well visualized. Additional Comments: Spectral Doppler performed. LEFT VENTRICLE PLAX 2D LVOT diam:     1.70 cm LVOT Area:     2.27 cm   AORTA Ao Root diam: 2.90 cm Ao Asc diam:  3.20 cm  SHUNTS Systemic Diam: 1.70 cm Kardie Tobb DO Electronically signed by Dub Huntsman DO Signature Date/Time: 01/10/2024/4:10:59 PM    Final    EP STUDY Result Date: 01/10/2024 See surgical note for result.  I spent 50 minutes involved in face-to-face and non-face-to-face activities for this patient on the day of the visit. Professional time spent includes the following activities: Preparing to see the patient (review of tests), Obtaining and reviewing separately obtained history (hospitalist progress note), Performing a medically appropriate examination and evaluation , Ordering medications/labs, referring and  communicating with other health care professionals, Documenting clinical information in the EMR, Independently interpreting results (not separately reported), Communicating results to the patient, Counseling and educating the patient and Care coordination (not separately reported).   Plan d/w requesting provider as well as ID pharm D  Of note, portions of this note may have been created with voice recognition software. While this note has been edited for accuracy, occasional wrong-word or 'sound-a-like' substitutions may have occurred due to the inherent limitations of voice recognition software.   Electronically signed by:   Annalee  Dea, MD Infectious Disease Physician [facility] Pager: [phone]

## 2024-01-11 NOTE — Plan of Care (Signed)

## 2024-01-12 ENCOUNTER — Telehealth: Payer: Self-pay | Admitting: *Deleted

## 2024-01-12 NOTE — Transitions of Care (Post Inpatient/ED Visit) (Signed)
 01/12/2024  Name: Molly Myers MRN: [id number] DOB: 03-Jan-1951  Today's TOC FU Call Status: Today's TOC FU Call Status:: Successful TOC FU Call Completed TOC FU Call Complete Date: 01/12/24  Patient's Name and Date of Birth confirmed. Name, DOB  Transition Care Management Follow-up Telephone Call Date of Discharge: 01/11/24 Discharge Facility: [facility]) Type of Discharge: Inpatient Admission Primary Inpatient Discharge Diagnosis:: Polymicrobial bacteremia How have you been since you were released from the hospital?: Better Any questions or concerns?: No  Items Reviewed: Did you receive and understand the discharge instructions provided?: Yes Medications obtained,verified, and reconciled?: Yes (Medications Reviewed) Any new allergies since your discharge?: No Dietary orders reviewed?: Yes Type of Diet Ordered:: Regular Do you have support at home?: Yes People in Home [RPT]: spouse Name of Support/Comfort Primary Source: Spouse/Craig  Medications Reviewed Today: Medications Reviewed Today     Reviewed by Lucky Andrea LABOR, RN (Registered Nurse) on 01/12/24 at [phone]  Med List Status: <None>   Medication Order Taking? Sig Documenting Provider Last Dose Status Informant  ADVAIR DISKUS 250-50 MCG/DOSE AEPB [id number] Yes INHALE 1 PUFF INTO THE LUNGS EVERY 12 HOURS NEED APPOINTMENT FOR FURTHER REFILLS  Patient taking differently: Inhale 1 puff into the lungs 2 (two) times daily as needed (shortness of breath, wheezing.).   Wert, Michael B, MD  Active Self, Pharmacy Records  albuterol  (PROVENTIL  HFA;VENTOLIN  HFA) 108 [phone] Base) MCG/ACT inhaler [id number] Yes Inhale 1-2 puffs into the lungs every 6 (six) hours as needed for wheezing or shortness of breath. [provider]  Active Self, Pharmacy Records  amoxicillin  (AMOXIL ) 500 MG capsule [id number] Yes Take 2 capsules (1,000 mg total) by mouth every 8 (eight) hours for 18 days. Manandhar, Sabina, MD  Active   aspirin EC 81 MG  tablet [id number] Yes Take 81 mg by mouth at bedtime. [provider]  Active Self, Pharmacy Records           Med Note (COFFELL, JON HERO   Tue Jan 09, 2024 12:27 PM) Patient states she was taking 81mg  every night for a few months (was not directed by MD) but states she stopped about 2 weeks ago (her preference).  ciprofloxacin  (CIPRO ) 500 MG tablet [id number] Yes Take 1 tablet (500 mg total) by mouth 2 (two) times daily for 18 days. Manandhar, Sabina, MD  Active   denosumab (PROLIA) 60 MG/ML SOSY injection [id number] Yes Inject 60 mg into the skin every 6 (six) months. [provider]  Active Self, Pharmacy Records           Med Note (COFFELL, JON HERO   Tue Jan 09, 2024 12:17 PM) Has not been injected in the past 30 days.  Evolocumab (REPATHA SURECLICK) 140 MG/ML SOAJ [id number] Yes Inject 140 mg into the skin every 28 (twenty-eight) days. [provider]  Active Self, Pharmacy Records           Med Note (COFFELL, JON HERO   Tue Jan 09, 2024 12:04 PM) Per patient, she has only been injecting about every 4 weeks  morphine  (MS CONTIN ) 30 MG 12 hr tablet [id number] Yes Take 30 mg by mouth every 12 (twelve) hours. [provider]  Active Self, Pharmacy Records  nicotine  (NICODERM CQ  - DOSED IN MG/24 HOURS) 21 mg/24hr patch [id number] Yes Place 1 patch (21 mg total) onto the skin daily. Raenelle Coria, MD  Active   nicotine  (NICODERM CQ  - DOSED IN MG/24 HOURS) 21 mg/24hr patch [id number] Yes Place 1 patch (21 mg  total) onto the skin daily. Fleeta Rothman, Jomarie SAILOR, MD  Active   Oxycodone  HCl 10 MG TABS [id number] Yes Take 10 mg by mouth See admin instructions. Take 1 tablet (10mg ) by mouth three times a day: in the morning (with 1st morphine  dose), 1 tablet around noon, and 1 tablet in the afternoon around 1630(with 2nd morphine  dose). Take 1 additional tablet at bedtime as needed for breakthrough pain. [provider]  Active Self, Pharmacy Records           Med Note  (COFFELL, JON HERO   Tue Jan 09, 2024 11:43 AM)    progesterone  (PROMETRIUM ) 100 MG capsule [id number] Yes Take 100 mg by mouth at bedtime. [provider]  Active Self, Pharmacy Records  thyroid  (ARMOUR) 15 MG tablet [id number] Yes Take 15 mg by mouth daily. take in addition to a 60mg  tablet to equal total daily dose of 75mg . [provider]  Active Self, Pharmacy Records           Med Note (COFFELL, JON HERO   Tue Jan 09, 2024 12:24 PM) No recent fill history found - strengths/directions provided by patient who endorses taking 75mg  QD.  thyroid  (ARMOUR) 60 MG tablet [id number] Yes Take 60 mg by mouth daily. take in addition to a 15mg  tablet to equal total daily dose of 75mg . [provider]  Active Self, Pharmacy Records  triamterene-hydrochlorothiazide [facility]) 37.5-25 MG tablet [id number] Yes Take 1 tablet by mouth daily as needed (lower extremity swelling). [provider]  Active Self, Pharmacy Records            Home Care and Equipment/Supplies: Were Home Health Services Ordered?: No Any new equipment or medical supplies ordered?: No  Functional Questionnaire: Do you need assistance with bathing/showering or dressing?: No Do you need assistance with meal preparation?: No Do you need assistance with eating?: No Do you have difficulty maintaining continence: No Do you need assistance with getting out of bed/getting out of a chair/moving?: No Do you have difficulty managing or taking your medications?: No  Follow up appointments reviewed: PCP Follow-up appointment confirmed?: No (Patient will call and schedule with PCP) MD Provider Line Number:[phone] Given: No Specialist Hospital Follow-up appointment confirmed?: Yes Date of Specialist follow-up appointment?: 01/19/24 Follow-Up Specialty Provider:: [facility] Do you need transportation to your follow-up appointment?: No Do you understand care options if your condition(s) worsen?: Yes-patient  verbalized understanding  SDOH Interventions Today    Flowsheet Row Most Recent Value  SDOH Interventions   Food Insecurity Interventions Intervention Not Indicated  Housing Interventions Intervention Not Indicated  Transportation Interventions Intervention Not Indicated  Utilities Interventions Intervention Not Indicated    Andrea Dimes RN, BSN [place]  [facility] RN Care Manager [phone]

## 2024-01-13 ENCOUNTER — Telehealth: Payer: Self-pay | Admitting: Infectious Disease

## 2024-01-13 ENCOUNTER — Other Ambulatory Visit: Payer: Self-pay | Admitting: Infectious Disease

## 2024-01-13 LAB — CULTURE, BLOOD (ROUTINE X 2)
Culture: NO GROWTH
Culture: NO GROWTH

## 2024-01-13 MED ORDER — LINEZOLID 600 MG PO TABS: 600.0000 mg | ORAL_TABLET | Freq: Two times a day (BID) | ORAL | 0 refills | Status: AC

## 2024-01-13 NOTE — Telephone Encounter (Signed)
 Molly Myers is having fairly severe abdominal discomfort and liquid stools every time she goes to the bathroom though she does not have diarrhea from a medical definition.  I am calling in zyvox  for her to use instead of the high dose amoxicillin  in case the latter is causing her symptoms  If she does not I mprove with switch she may need to go to ER in case something else is going on such as migration of the stent.

## 2024-01-14 ENCOUNTER — Ambulatory Visit: Payer: Self-pay | Admitting: Gastroenterology

## 2024-01-15 ENCOUNTER — Other Ambulatory Visit (HOSPITAL_COMMUNITY): Payer: Self-pay

## 2024-01-15 ENCOUNTER — Telehealth: Payer: Self-pay

## 2024-01-15 NOTE — Telephone Encounter (Signed)
 Pharmacy Patient Advocate Encounter  Received notification from [employer] that Prior Authorization for LINEZOLID   has been APPROVED from 02/08/23 to 02/12/24. Ran test claim, Copay is $22.85. This test claim was processed through [facility]- copay amounts may vary at other pharmacies due to pharmacy/plan contracts, or as the patient moves through the different stages of their insurance plan.   PA #/Case ID/Reference #: [id number]  Patient filling with   [id number] - [place], [place] - [address] AT [facility] [address], [place] [address] Phone: [phone]  Fax: [phone] DEA #: [id number]

## 2024-01-15 NOTE — Telephone Encounter (Addendum)
 Pharmacy Patient Advocate Encounter   Received notification from [employer] that prior authorization for LINEZOLID  is required/requested.   Insurance verification completed.   The patient is insured through [facility]. MEDICARE ([employer])   Per test claim: PA required; PA submitted to above mentioned insurance via [employer] Key/confirmation #/EOC [id number] Status is pending

## 2024-01-15 NOTE — Telephone Encounter (Signed)
 Attempted to contact patient, no answer or vm to leave a message.   Molly Myers SHAUNNA Letters, CMA

## 2024-01-16 NOTE — Telephone Encounter (Signed)
 Patient aware. Patient stated that she has improved somewhat - hydrating and eating yogurt. Patient will STOP amoxicillin . Patient will pick up RX for Linezolid . Patient took first dose of Amoxicillin  at 8 AM today. When could she start Linezolid ?

## 2024-01-16 NOTE — Telephone Encounter (Signed)
 Patient aware

## 2024-01-16 NOTE — Telephone Encounter (Signed)
 Attempted to contact patient on home and mobile number. Left vm asking for call back.  Shreena Baines SHAUNNA Letters, CMA

## 2024-01-16 NOTE — Telephone Encounter (Signed)
 She can take her first dose of linezolid  tonight and then start the twice daily tomorrow morning.

## 2024-01-19 ENCOUNTER — Other Ambulatory Visit: Payer: Self-pay

## 2024-01-19 ENCOUNTER — Encounter: Payer: Self-pay | Admitting: Internal Medicine

## 2024-01-19 ENCOUNTER — Ambulatory Visit (INDEPENDENT_AMBULATORY_CARE_PROVIDER_SITE_OTHER): Payer: Self-pay | Admitting: Internal Medicine

## 2024-01-19 VITALS — BP 130/74 | HR 83 | Temp 97.6°F | Ht 63.0 in | Wt 130.0 lb

## 2024-01-19 DIAGNOSIS — K219 Gastro-esophageal reflux disease without esophagitis: Secondary | ICD-10-CM | POA: Diagnosis not present

## 2024-01-19 DIAGNOSIS — B952 Enterococcus as the cause of diseases classified elsewhere: Secondary | ICD-10-CM

## 2024-01-19 DIAGNOSIS — B3731 Acute candidiasis of vulva and vagina: Secondary | ICD-10-CM

## 2024-01-19 DIAGNOSIS — Z79899 Other long term (current) drug therapy: Secondary | ICD-10-CM

## 2024-01-19 DIAGNOSIS — R7881 Bacteremia: Secondary | ICD-10-CM

## 2024-01-19 DIAGNOSIS — B9620 Unspecified Escherichia coli [E. coli] as the cause of diseases classified elsewhere: Secondary | ICD-10-CM

## 2024-01-19 DIAGNOSIS — A499 Bacterial infection, unspecified: Secondary | ICD-10-CM

## 2024-01-19 LAB — CBC WITH DIFFERENTIAL/PLATELET
Absolute Lymphocytes: 1871 {cells}/uL (ref 850–3900)
Absolute Monocytes: 792 {cells}/uL (ref 200–950)
Basophils Absolute: 79 {cells}/uL (ref 0–200)
Basophils Relative: 0.8 %
Eosinophils Absolute: 208 {cells}/uL (ref 15–500)
Eosinophils Relative: 2.1 %
HCT: 43.5 % (ref 35.9–46.0)
Hemoglobin: 14 g/dL (ref 11.7–15.5)
MCH: 28.2 pg (ref 27.0–33.0)
MCHC: 32.2 g/dL (ref 31.6–35.4)
MCV: 87.5 fL (ref 81.4–101.7)
MPV: 9.7 fL (ref 7.5–12.5)
Monocytes Relative: 8 %
Neutro Abs: 6950 {cells}/uL (ref 1500–7800)
Neutrophils Relative %: 70.2 %
Platelets: 369 Thousand/uL (ref 140–400)
RBC: 4.97 Million/uL (ref 3.80–5.10)
RDW: 13.4 % (ref 11.0–15.0)
Total Lymphocyte: 18.9 %
WBC: 9.9 Thousand/uL (ref 3.8–10.8)

## 2024-01-19 MED ORDER — FLUCONAZOLE 150 MG PO TABS: 150.0000 mg | ORAL_TABLET | Freq: Every day | ORAL | 0 refills | Status: AC

## 2024-01-19 MED ORDER — PANTOPRAZOLE SODIUM 40 MG PO TBEC: 40.0000 mg | DELAYED_RELEASE_TABLET | Freq: Every day | ORAL | 3 refills | Status: AC

## 2024-01-19 NOTE — Progress Notes (Unsigned)
 Patient ID: Molly Myers, female   DOB: 03/03/1950, 73 y.o.   MRN: [id number]  HPI 73yo F hospitalized for choledocolithiasis, cholangitis, and Efaecium and ecoli bacteremia. Was on amoxicillin  but having GI bloating and diarrhea. Feels much better. Due to gallstones.   12/6- 16 d supply of linezolid  --through 12/22 EOT.  Outpatient Encounter Medications as of 01/19/2024  Medication Sig   ADVAIR DISKUS 250-50 MCG/DOSE AEPB INHALE 1 PUFF INTO THE LUNGS EVERY 12 HOURS NEED APPOINTMENT FOR FURTHER REFILLS (Patient taking differently: Inhale 1 puff into the lungs 2 (two) times daily as needed (shortness of breath, wheezing.).)   albuterol  (PROVENTIL  HFA;VENTOLIN  HFA) 108 (90 Base) MCG/ACT inhaler Inhale 1-2 puffs into the lungs every 6 (six) hours as needed for wheezing or shortness of breath.   aspirin EC 81 MG tablet Take 81 mg by mouth at bedtime.   ciprofloxacin  (CIPRO ) 500 MG tablet Take 1 tablet (500 mg total) by mouth 2 (two) times daily for 18 days.   denosumab (PROLIA) 60 MG/ML SOSY injection Inject 60 mg into the skin every 6 (six) months.   Evolocumab (REPATHA SURECLICK) 140 MG/ML SOAJ Inject 140 mg into the skin every 28 (twenty-eight) days.   linezolid  (ZYVOX ) 600 MG tablet Take 1 tablet (600 mg total) by mouth 2 (two) times daily for 16 days.   morphine  (MS CONTIN ) 30 MG 12 hr tablet Take 30 mg by mouth every 12 (twelve) hours.   nicotine  (NICODERM CQ  - DOSED IN MG/24 HOURS) 21 mg/24hr patch Place 1 patch (21 mg total) onto the skin daily.   nicotine  (NICODERM CQ  - DOSED IN MG/24 HOURS) 21 mg/24hr patch Place 1 patch (21 mg total) onto the skin daily.   Oxycodone  HCl 10 MG TABS Take 10 mg by mouth See admin instructions. Take 1 tablet (10mg ) by mouth three times a day: in the morning (with 1st morphine  dose), 1 tablet around noon, and 1 tablet in the afternoon around 1630(with 2nd morphine  dose). Take 1 additional tablet at bedtime as needed for breakthrough pain.   progesterone   (PROMETRIUM ) 100 MG capsule Take 100 mg by mouth at bedtime.   thyroid  (ARMOUR) 15 MG tablet Take 15 mg by mouth daily. take in addition to a 60mg  tablet to equal total daily dose of 75mg .   thyroid  (ARMOUR) 60 MG tablet Take 60 mg by mouth daily. take in addition to a 15mg  tablet to equal total daily dose of 75mg .   triamterene-hydrochlorothiazide (MAXZIDE-25) 37.5-25 MG tablet Take 1 tablet by mouth daily as needed (lower extremity swelling).   No facility-administered encounter medications on file as of 01/19/2024.     Patient Active Problem List   Diagnosis Date Noted   Sepsis (HCC) 01/09/2024   Community acquired pneumonia of right lower lobe of lung 01/09/2024   Cholangitis (HCC) 01/08/2024   Abnormal LFTs 01/08/2024   Choledocholithiasis 01/08/2024   Bacteremia due to Gram-negative bacteria 01/08/2024   Abdominal pain, RUQ 01/08/2024   Erosive gastritis 01/08/2024   Polymicrobial bacteremia 01/07/2024   Asthma 07/24/2015     Health Maintenance Due  Topic Date Due   Medicare Annual Wellness (AWV)  Never done   DTaP/Tdap/Td (1 - Tdap) Never done   Pneumococcal Vaccine: 50+ Years (1 of 2 - PCV) Never done   Colonoscopy  Never done   Zoster Vaccines- Shingrix (1 of 2) Never done   Mammogram  10/07/2019   Influenza Vaccine  Never done   COVID-19 Vaccine (1 - 2025-26 season) Never  done     Review of Systems  Physical Exam   BP 130/74   Pulse 83   Temp 97.6 F (36.4 C) (Temporal)   Ht 5' 3 (1.6 m)   Wt 130 lb (59 kg)   SpO2 96%   BMI 23.03 kg/m    No results found for: CD4TCELL No results found for: CD4TABS No results found for: HIV1RNAQUANT No results found for: HEPBSAB No results found for: RPR, LABRPR  CBC Lab Results  Component Value Date   WBC 13.6 (H) 01/10/2024   RBC 4.36 01/10/2024   HGB 12.5 01/10/2024   HCT 37.0 01/10/2024   PLT 169 01/10/2024   MCV 84.9 01/10/2024   MCH 28.7 01/10/2024   MCHC 33.8 01/10/2024   RDW 14.2  01/10/2024   LYMPHSABS 1.7 01/10/2024   MONOABS 0.5 01/10/2024   EOSABS 0.0 01/10/2024    BMET Lab Results  Component Value Date   NA 138 01/10/2024   K 4.2 01/10/2024   CL 104 01/10/2024   CO2 24 01/10/2024   GLUCOSE 103 (H) 01/10/2024   BUN 15 01/10/2024   CREATININE 0.86 01/10/2024   CALCIUM 7.8 (L) 01/10/2024   GFRNONAA >60 01/10/2024      Assessment and Plan  Ecoli and enterococcal bacteremia =continue on both meds until 12/22 to complete course  High risk med = will check cbc while on linezolid   GERD = start pantoprazole  40mg  daily,, if still worse then add h2 blocker, pepcid in the evening; follow up with dr charla  Possible yeast infection = can use fluconazole as needed for symptoms  Follow up in 2 wk

## 2024-01-24 ENCOUNTER — Telehealth: Payer: Self-pay | Admitting: Infectious Disease

## 2024-01-24 ENCOUNTER — Ambulatory Visit: Admitting: Infectious Disease

## 2024-01-24 ENCOUNTER — Encounter: Payer: Self-pay | Admitting: Infectious Disease

## 2024-01-24 ENCOUNTER — Other Ambulatory Visit: Payer: Self-pay

## 2024-01-24 VITALS — BP 151/78 | HR 82 | Temp 98.7°F | Resp 16 | Wt 132.2 lb

## 2024-01-24 DIAGNOSIS — R5383 Other fatigue: Secondary | ICD-10-CM | POA: Diagnosis not present

## 2024-01-24 DIAGNOSIS — R5381 Other malaise: Secondary | ICD-10-CM

## 2024-01-24 DIAGNOSIS — R7881 Bacteremia: Secondary | ICD-10-CM | POA: Diagnosis present

## 2024-01-24 DIAGNOSIS — K8050 Calculus of bile duct without cholangitis or cholecystitis without obstruction: Secondary | ICD-10-CM

## 2024-01-24 DIAGNOSIS — R109 Unspecified abdominal pain: Secondary | ICD-10-CM

## 2024-01-24 DIAGNOSIS — B9620 Unspecified Escherichia coli [E. coli] as the cause of diseases classified elsewhere: Secondary | ICD-10-CM | POA: Diagnosis not present

## 2024-01-24 DIAGNOSIS — R1011 Right upper quadrant pain: Secondary | ICD-10-CM

## 2024-01-24 DIAGNOSIS — J45909 Unspecified asthma, uncomplicated: Secondary | ICD-10-CM

## 2024-01-24 DIAGNOSIS — Z9049 Acquired absence of other specified parts of digestive tract: Secondary | ICD-10-CM | POA: Diagnosis not present

## 2024-01-24 DIAGNOSIS — B952 Enterococcus as the cause of diseases classified elsewhere: Secondary | ICD-10-CM | POA: Insufficient documentation

## 2024-01-24 DIAGNOSIS — A499 Bacterial infection, unspecified: Secondary | ICD-10-CM

## 2024-01-24 NOTE — Telephone Encounter (Signed)
 Patient scheduled.

## 2024-01-24 NOTE — Progress Notes (Signed)
 Subjective:  Chief Complaint: malaise, and recent abdominal pain   Patient ID: Molly Myers, female    DOB: 1950/07/23, 73 y.o.   MRN: [id number]  HPI  Discussed the use of AI scribe software for clinical note transcription with the patient, who gave verbal consent to proceed.  History of Present Illness   Molly Myers is a 73 year old female with a  history of cholecystectomy performed thirty years ago and has experienced multiple episodes of (what I believe were likely undiagnosed bacteremia,) recently culminating in severe weakness, abdominal pain, and confusion. She was found to be in septic shock due to polymicrobial bacteremia with Enterococcus faecium and E. coli, with the source identified as multiple stones within the biliary tree.  She underwent an ERCP with biliary sphincterotomy, sphincteroplasty, and stent placement. 2D echocardiogram showed aortic calcification versus vegetation, but a subsequent TEE showed no evidence of endocarditis. Initially treated with vancomycin , and zosyn  she was switched to Zosyn , and finally placed on oral ciprofloxacin  and high-dose amoxicillin . Due to significant abdominal pain, her amoxicillin  was changed to Zyvox  while continuing ciprofloxacin , which alleviated the abdominal pain but she continued to experience malaise.  Recently, she experienced a severe episode of abdominal pain described as a 'jab' that occurred a couple of times, with the last episode being the day before the visit. The pain was sharp and brief, lasting only a few seconds. She also reports ongoing loose bowel movements, though they have improved in consistency since the medication change.  She has been feeling profound fatigue over the last two to three days, which is a new symptom since the change in her antibiotic regimen. She is concerned about the possibility of another infection, as she was previously unaware of her bacteremia u.  Her current medications include Zyvox  and  ciprofloxacin , with approximately ten more days of antibiotics remaining--she believes.. She reports that she no longer experiences severe abdominal pain or swelling. Ongoing fatigue and loose stools, but no longer experiences the severe abdominal pain she had prior to the medication change.        Past Medical History:  Diagnosis Date   Asthma    Hypothyroidism     Past Surgical History:  Procedure Laterality Date   BREAST REDUCTION SURGERY Bilateral benign tumors   CHOLECYSTECTOMY  in the 80s   ERCP N/A 01/08/2024   Procedure: ERCP, WITH INTERVENTION IF INDICATED;  Surgeon: Mansouraty, Aloha Raddle., MD;  Location: [facility];  Service: Gastroenterology;  Laterality: N/A;  Day trip from [place] to [place] today 01/08/2024   TRANSESOPHAGEAL ECHOCARDIOGRAM (CATH LAB) N/A 01/10/2024   Procedure: TRANSESOPHAGEAL ECHOCARDIOGRAM;  Surgeon: Sheena Pugh, DO;  Location: [facility];  Service: Cardiovascular;  Laterality: N/A;    Family History  Problem Relation Age of Onset   Asthma Brother        childhood   Asthma Father        adult onset   Hypertension Maternal Aunt    Congestive Heart Failure Maternal Grandfather    Hypertension Mother    Congestive Heart Failure Maternal Aunt       Social History   Socioeconomic History   Marital status: Single    Spouse name: Not on file   Number of children: Not on file   Years of education: Not on file   Highest education level: Not on file  Occupational History   Not on file  Tobacco Use   Smoking status: Every Day    Current  packs/day: 0.75    Average packs/day: 0.8 packs/day for 48.0 years (36.0 ttl pk-yrs)    Types: Cigarettes   Smokeless tobacco: Never   Tobacco comments:    typically smokes between 1/4 and 1/2 ppd (04/20/15)  Vaping Use   Vaping status: Former  Substance and Sexual Activity   Alcohol use: No    Alcohol/week: 0.0 standard drinks of alcohol   Drug use: No   Sexual activity: Not on file   Other Topics Concern   Not on file  Social History Narrative   Lives with fiance and mother (dementia, pt is the sole caregiver)   [employer] - 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 [employer]   No recent travel   No children   Social Drivers of Health   Tobacco Use: High Risk (01/19/2024)   Patient History    Smoking Tobacco Use: Every Day    Smokeless Tobacco Use: Never    Passive Exposure: Not on file  Financial Resource Strain: Not on file  Food Insecurity: No Food Insecurity (01/12/2024)   Epic    Worried About Programme Researcher, Broadcasting/film/video in the Last Year: Never true    Ran Out of Food in the Last Year: Never true  Transportation Needs: No Transportation Needs (01/12/2024)   Epic    Lack of Transportation (Medical): No    Lack of Transportation (Non-Medical): No  Physical Activity: Not on file  Stress: Not on file  Social Connections: Moderately Isolated (01/07/2024)   Social Connection and Isolation Panel    Frequency of Communication with Friends and Family: More than three times a week    Frequency of Social Gatherings with Friends and Family: Never    Attends Religious Services: Never    Database Administrator or Organizations: No    Attends Banker Meetings: Never    Marital Status: Married  Depression (PHQ2-9): Low Risk (01/12/2024)   Depression (PHQ2-9)    PHQ-2 Score: 0  Alcohol Screen: Not on file  Housing: Unknown (01/12/2024)   Epic    Unable to Pay for Housing in the Last Year: No    Number of Times Moved in the Last Year: Not on file    Homeless in the Last Year: No  Utilities: Not At Risk (01/12/2024)   Epic    Threatened with loss of utilities: No  Health Literacy: Not on file    Allergies[1]  Current Medications[2]   Review of Systems  Constitutional:  Negative for activity change, appetite change, chills, diaphoresis, fatigue, fever and unexpected weight change.  HENT:  Negative for congestion, rhinorrhea, sinus pressure, sneezing, sore  throat and trouble swallowing.   Eyes:  Negative for photophobia and visual disturbance.  Respiratory:  Negative for cough, chest tightness, shortness of breath, wheezing and stridor.   Cardiovascular:  Negative for chest pain, palpitations and leg swelling.  Gastrointestinal:  Negative for abdominal distention, abdominal pain, anal bleeding, blood in stool, constipation, diarrhea, nausea and vomiting.  Genitourinary:  Negative for difficulty urinating, dysuria, flank pain and hematuria.  Musculoskeletal:  Negative for arthralgias, back pain, gait problem, joint swelling and myalgias.  Skin:  Negative for color change, pallor, rash and wound.  Neurological:  Negative for dizziness, tremors, weakness and light-headedness.  Hematological:  Negative for adenopathy. Does not bruise/bleed easily.  Psychiatric/Behavioral:  Negative for agitation, behavioral problems, confusion, decreased concentration, dysphoric mood and sleep disturbance.        Objective:   Physical Exam Constitutional:  General: She is not in acute distress.    Appearance: Normal appearance. She is well-developed. She is not ill-appearing or diaphoretic.  HENT:     Head: Normocephalic and atraumatic.     Right Ear: Hearing and external ear normal.     Left Ear: Hearing and external ear normal.     Nose: No nasal deformity or rhinorrhea.  Eyes:     General: No scleral icterus.    Conjunctiva/sclera: Conjunctivae normal.     Right eye: Right conjunctiva is not injected.     Left eye: Left conjunctiva is not injected.     Pupils: Pupils are equal, round, and reactive to light.  Neck:     Vascular: No JVD.  Cardiovascular:     Rate and Rhythm: Normal rate and regular rhythm.     Heart sounds: S1 normal and S2 normal.  Pulmonary:     Effort: No respiratory distress.     Breath sounds: No wheezing.  Abdominal:     General: There is no distension.     Palpations: Abdomen is soft. There is no mass.     Tenderness:  There is no abdominal tenderness.     Hernia: No hernia is present.  Musculoskeletal:        General: Normal range of motion.     Right shoulder: Normal.     Left shoulder: Normal.     Cervical back: Normal range of motion and neck supple.     Right hip: Normal.     Left hip: Normal.     Right knee: Normal.     Left knee: Normal.  Lymphadenopathy:     Head:     Right side of head: No submandibular, preauricular or posterior auricular adenopathy.     Left side of head: No submandibular, preauricular or posterior auricular adenopathy.     Cervical: No cervical adenopathy.     Right cervical: No superficial or deep cervical adenopathy.    Left cervical: No superficial or deep cervical adenopathy.  Skin:    General: Skin is warm and dry.     Coloration: Skin is not pale.     Findings: No abrasion, bruising, ecchymosis, erythema, lesion or rash.     Nails: There is no clubbing.  Neurological:     General: No focal deficit present.     Mental Status: She is alert and oriented to person, place, and time.     Sensory: No sensory deficit.     Coordination: Coordination normal.     Gait: Gait normal.  Psychiatric:        Attention and Perception: She is attentive.        Mood and Affect: Mood normal.        Speech: Speech normal.        Behavior: Behavior normal. Behavior is cooperative.        Thought Content: Thought content normal.        Judgment: Judgment normal.           Assessment & Plan:   ,Assessment and Plan    Polymicrobial bacteremia with Enterococcus, E. coli  due to biliary stones. I sppost ERCP with stent placement,c learance of bacteremia, negative TEE, DC on high dose amox + cipro  but GI side effects prompted swtich to Zyvox  and cipro , now still with significant fatigue that is worse and an episode of abdominal pain  - Ordered CBC and CMP to assess WBC, hemoglobin, platelets, and liver function. - Ordered  blood cultures to rule out bacteremia, aware of  contamination possibility - Advised gastroenterology follow-up --barring a problem with zyvox  and TTPenia  Continue ciprofloxacin  and Zyvox . to complete therapy.          [1]  Allergies Allergen Reactions   Iodinated Contrast Media Shortness Of Breath and Rash   Ery-Tab [Erythromycin] Nausea And Vomiting   Statins Other (See Comments)    Myalgias  Fatigue   [2]  Current Outpatient Medications:    ADVAIR DISKUS 250-50 MCG/DOSE AEPB, INHALE 1 PUFF INTO THE LUNGS EVERY 12 HOURS NEED APPOINTMENT FOR FURTHER REFILLS (Patient taking differently: Inhale 1 puff into the lungs 2 (two) times daily as needed (shortness of breath, wheezing.).), Disp: 180 each, Rfl: 5   albuterol  (PROVENTIL  HFA;VENTOLIN  HFA) 108 (90 Base) MCG/ACT inhaler, Inhale 1-2 puffs into the lungs every 6 (six) hours as needed for wheezing or shortness of breath., Disp: , Rfl:    aspirin EC 81 MG tablet, Take 81 mg by mouth at bedtime., Disp: , Rfl:    ciprofloxacin  (CIPRO ) 500 MG tablet, Take 1 tablet (500 mg total) by mouth 2 (two) times daily for 18 days., Disp: 36 tablet, Rfl: 0   denosumab (PROLIA) 60 MG/ML SOSY injection, Inject 60 mg into the skin every 6 (six) months., Disp: , Rfl:    Evolocumab (REPATHA SURECLICK) 140 MG/ML SOAJ, Inject 140 mg into the skin every 28 (twenty-eight) days., Disp: , Rfl:    fluconazole  (DIFLUCAN ) 150 MG tablet, Take 1 tablet (150 mg total) by mouth daily. If having symptoms for yeast infection, can repeat in 5 days if still having symptoms, Disp: 3 tablet, Rfl: 0   linezolid  (ZYVOX ) 600 MG tablet, Take 1 tablet (600 mg total) by mouth 2 (two) times daily for 16 days., Disp: 32 tablet, Rfl: 0   morphine  (MS CONTIN ) 30 MG 12 hr tablet, Take 30 mg by mouth every 12 (twelve) hours., Disp: , Rfl:    nicotine  (NICODERM CQ  - DOSED IN MG/24 HOURS) 21 mg/24hr patch, Place 1 patch (21 mg total) onto the skin daily., Disp: 28 patch, Rfl: 0   nicotine  (NICODERM CQ  - DOSED IN MG/24 HOURS) 21 mg/24hr  patch, Place 1 patch (21 mg total) onto the skin daily., Disp: 42 patch, Rfl: 0   Oxycodone  HCl 10 MG TABS, Take 10 mg by mouth See admin instructions. Take 1 tablet (10mg ) by mouth three times a day: in the morning (with 1st morphine  dose), 1 tablet around noon, and 1 tablet in the afternoon around 1630(with 2nd morphine  dose). Take 1 additional tablet at bedtime as needed for breakthrough pain., Disp: , Rfl: 0   pantoprazole  (PROTONIX ) 40 MG tablet, Take 1 tablet (40 mg total) by mouth daily., Disp: 30 tablet, Rfl: 3   progesterone  (PROMETRIUM ) 100 MG capsule, Take 100 mg by mouth at bedtime., Disp: , Rfl:    thyroid  (ARMOUR) 15 MG tablet, Take 15 mg by mouth daily. take in addition to a 60mg  tablet to equal total daily dose of 75mg ., Disp: , Rfl:    thyroid  (ARMOUR) 60 MG tablet, Take 60 mg by mouth daily. take in addition to a 15mg  tablet to equal total daily dose of 75mg ., Disp: , Rfl:    triamterene-hydrochlorothiazide (MAXZIDE-25) 37.5-25 MG tablet, Take 1 tablet by mouth daily as needed (lower extremity swelling)., Disp: , Rfl:

## 2024-01-24 NOTE — Telephone Encounter (Signed)
 Can we put Cathy in my 3pm appt  She is having a bunch of symptoms and I think she needs to be seen and have labs.

## 2024-01-25 ENCOUNTER — Telehealth: Payer: Self-pay | Admitting: Infectious Disease

## 2024-01-25 NOTE — Telephone Encounter (Signed)
 I'm sure linezolid  could contribute to some fatigue, but I do not believe it would lead to exhaustion or impact sense of smell at all.

## 2024-01-25 NOTE — Telephone Encounter (Signed)
 Lab results faxed.

## 2024-01-25 NOTE — Telephone Encounter (Signed)
 I let Molly Myers know all of her labs were normal I tried to use epic to send them to her PCP office per his request  Molly Myers Fax number is [phone]  Can we make sure Dr. Clarice gets a copy of her labs.  I also wanted to ask Molly Myers or Molly Myers if they would think some of her symptoms that I cannot certainly explain by labs of feeling exhausted and having intense sense of smell from time to time could be related to the zyvox ?  We changed her to that from amoxicillin  which was causing pretty bad GI upset

## 2024-01-26 ENCOUNTER — Telehealth: Payer: Self-pay | Admitting: Gastroenterology

## 2024-01-26 NOTE — Telephone Encounter (Signed)
 Inbound call from patient stating she would like to know if its normal to feel extremely tired after having an ERCP on 01/08/24 Requesting a call back  Please advise  Thank you

## 2024-01-26 NOTE — Telephone Encounter (Signed)
Attempted to call pt no answer no voicemail.  

## 2024-01-29 NOTE — Telephone Encounter (Signed)
 Attempted to reach pt no answer no voice mail will await further communication from the pt

## 2024-01-30 LAB — CBC WITH DIFFERENTIAL/PLATELET
Absolute Lymphocytes: 2565 {cells}/uL (ref 850–3900)
Absolute Monocytes: 338 {cells}/uL (ref 200–950)
Basophils Absolute: 68 {cells}/uL (ref 0–200)
Basophils Relative: 0.9 %
Eosinophils Absolute: 368 {cells}/uL (ref 15–500)
Eosinophils Relative: 4.9 %
HCT: 41.8 % (ref 35.9–46.0)
Hemoglobin: 13.7 g/dL (ref 11.7–15.5)
MCH: 28 pg (ref 27.0–33.0)
MCHC: 32.8 g/dL (ref 31.6–35.4)
MCV: 85.3 fL (ref 81.4–101.7)
MPV: 9.6 fL (ref 7.5–12.5)
Monocytes Relative: 4.5 %
Neutro Abs: 4163 {cells}/uL (ref 1500–7800)
Neutrophils Relative %: 55.5 %
Platelets: 293 Thousand/uL (ref 140–400)
RBC: 4.9 Million/uL (ref 3.80–5.10)
RDW: 13.1 % (ref 11.0–15.0)
Total Lymphocyte: 34.2 %
WBC: 7.5 Thousand/uL (ref 3.8–10.8)

## 2024-01-30 LAB — CULTURE, BLOOD (SINGLE)
MICRO NUMBER:: 17368681
MICRO NUMBER:: 17368682
Result:: NO GROWTH
Result:: NO GROWTH
SPECIMEN QUALITY:: ADEQUATE
SPECIMEN QUALITY:: ADEQUATE

## 2024-01-30 LAB — COMPLETE METABOLIC PANEL WITHOUT GFR
AG Ratio: 1.7 (calc) (ref 1.0–2.5)
ALT: 18 U/L (ref 6–29)
AST: 23 U/L (ref 10–35)
Albumin: 4 g/dL (ref 3.6–5.1)
Alkaline phosphatase (APISO): 80 U/L (ref 37–153)
BUN: 13 mg/dL (ref 7–25)
CO2: 23 mmol/L (ref 20–32)
Calcium: 8.6 mg/dL (ref 8.6–10.4)
Chloride: 105 mmol/L (ref 98–110)
Creat: 0.94 mg/dL (ref 0.60–1.00)
Globulin: 2.4 g/dL (ref 1.9–3.7)
Glucose, Bld: 88 mg/dL (ref 65–99)
Potassium: 4 mmol/L (ref 3.5–5.3)
Sodium: 138 mmol/L (ref 135–146)
Total Bilirubin: 0.7 mg/dL (ref 0.2–1.2)
Total Protein: 6.4 g/dL (ref 6.1–8.1)

## 2024-02-13 ENCOUNTER — Other Ambulatory Visit (HOSPITAL_COMMUNITY): Payer: Self-pay

## 2024-02-28 ENCOUNTER — Ambulatory Visit: Admitting: Gastroenterology

## 2024-02-28 ENCOUNTER — Ambulatory Visit: Payer: Self-pay | Admitting: Gastroenterology

## 2024-02-28 ENCOUNTER — Other Ambulatory Visit

## 2024-02-28 ENCOUNTER — Encounter: Payer: Self-pay | Admitting: Gastroenterology

## 2024-02-28 VITALS — BP 122/80 | Ht 63.0 in | Wt 135.0 lb

## 2024-02-28 DIAGNOSIS — K8309 Other cholangitis: Secondary | ICD-10-CM

## 2024-02-28 DIAGNOSIS — A419 Sepsis, unspecified organism: Secondary | ICD-10-CM

## 2024-02-28 DIAGNOSIS — R55 Syncope and collapse: Secondary | ICD-10-CM

## 2024-02-28 DIAGNOSIS — K5909 Other constipation: Secondary | ICD-10-CM | POA: Diagnosis not present

## 2024-02-28 DIAGNOSIS — R7989 Other specified abnormal findings of blood chemistry: Secondary | ICD-10-CM

## 2024-02-28 DIAGNOSIS — K8050 Calculus of bile duct without cholangitis or cholecystitis without obstruction: Secondary | ICD-10-CM | POA: Diagnosis not present

## 2024-02-28 LAB — CBC WITH DIFFERENTIAL/PLATELET
Basophils Absolute: 0.1 K/uL (ref 0.0–0.1)
Basophils Relative: 1.3 % (ref 0.0–3.0)
Eosinophils Absolute: 0.4 K/uL (ref 0.0–0.7)
Eosinophils Relative: 4.4 % (ref 0.0–5.0)
HCT: 40.1 % (ref 36.0–46.0)
Hemoglobin: 13.7 g/dL (ref 12.0–15.0)
Lymphocytes Relative: 20.5 % (ref 12.0–46.0)
Lymphs Abs: 2.1 K/uL (ref 0.7–4.0)
MCHC: 34.1 g/dL (ref 30.0–36.0)
MCV: 83.5 fl (ref 78.0–100.0)
Monocytes Absolute: 0.7 K/uL (ref 0.1–1.0)
Monocytes Relative: 6.8 % (ref 3.0–12.0)
Neutro Abs: 6.8 K/uL (ref 1.4–7.7)
Neutrophils Relative %: 67 % (ref 43.0–77.0)
Platelets: 215 K/uL (ref 150.0–400.0)
RBC: 4.8 Mil/uL (ref 3.87–5.11)
RDW: 14.3 % (ref 11.5–15.5)
WBC: 10.1 K/uL (ref 4.0–10.5)

## 2024-02-28 LAB — COMPREHENSIVE METABOLIC PANEL WITH GFR
ALT: 7 U/L (ref 3–35)
AST: 18 U/L (ref 5–37)
Albumin: 4.1 g/dL (ref 3.5–5.2)
Alkaline Phosphatase: 56 U/L (ref 39–117)
BUN: 12 mg/dL (ref 6–23)
CO2: 27 meq/L (ref 19–32)
Calcium: 10 mg/dL (ref 8.4–10.5)
Chloride: 103 meq/L (ref 96–112)
Creatinine, Ser: 0.89 mg/dL (ref 0.40–1.20)
GFR: 63.7 mL/min
Glucose, Bld: 97 mg/dL (ref 70–99)
Potassium: 3.9 meq/L (ref 3.5–5.1)
Sodium: 137 meq/L (ref 135–145)
Total Bilirubin: 0.9 mg/dL (ref 0.2–1.2)
Total Protein: 7 g/dL (ref 6.0–8.3)

## 2024-02-28 NOTE — Progress Notes (Signed)
 "  Raynette Arras [id number] [birth year]   Chief Complaint: Hospital follow up  Referring Provider: Clarice Nottingham, MD Primary GI MD: Dr. Wilhelmenia  HPI: Molly Myers is a 74 y.o. female with past medical history of cholecystectomy 30 years ago, hypothyroidism, chronic back pain who presents today for hospital follow up.  Patient admitted to the hospital 01/07/2024 to 01/11/2024 after presenting with confusion, fever, and shortness of breath.  In brief summary, she was found to have cholangitis and pneumonia as well as bacteremia.  Underwent ERCP, biliary sphincterotomy performed.  Purulent cholangitis noted.  Followed by ID and GI.  Discharged home on oral antibiotics.  Presented with fever, leukocytosis with evidence of cholangitis. MRCP with choledocholithiasis and dilated common bile duct 1.6 cm ERCP with choledocholithiasis, complete removal by biliary sphincterotomy and balloon sphincteroplasty-biliary stent placed, purulent drainage-stent removal in 2 months that GI will schedule LFTs improving and normalized. Blood cultures 11/30 E. coli, Enterococcus faecium TTE 12/2, suspected aortic calcification versus vegetation TEE 12/3, no evidence of aortic vegetation. Clinically improving.  ID recommended oral ciprofloxacin  and amoxicillin  for additional 18 days to complete 3 weeks of therapy.   Patient has remote history of cholecystectomy in the 1980s, with previous history of choledocholithiasis requiring ERCP and stone extraction about a year postcholecystectomy.  Labs 01/24/2024: Normal CBC, normal CMP, blood cultures negative   Discussed the use of AI scribe software for clinical note transcription with the patient, who gave verbal consent to proceed.  History of Present Illness Molly Myers is a 74 year old female with prior cholecystectomy and recent choledocholithiasis status post ERCP with biliary stent placement who presents for follow-up after hospitalization for  cholangitis. Here today with her significant other.  Biliary Tract Disease and Cholangitis: - Recent hospitalization for choledocholithiasis despite remote cholecystectomy - Fourteen bile duct stones removed via ERCP; plastic biliary stent placed - No abdominal pain before or during episode; unaware of stones until diagnosis - Initial laboratory evaluation revealed elevated liver enzymes, which normalized after intervention - Blood cultures negative; bilirubin normalized - Treated with antibiotics, initially causing significant fatigue and severe diarrhea; change in antibiotics led to improvement in gastrointestinal side effects - Continues to perceive some yellowing of her eyes; caregiver observed facial flushing during illness - No current chest pain, but experiences intermittent somewhat sharp little niggly pains in her chest - No difficulty breathing - Urine was practically black during hospitalization, attributed to bile  Neurocognitive Symptoms: - Over four months prior to hospitalization, experienced episodes of confusion and abrupt sleepiness, sometimes resulting in folding over and amnesia for the events - Paramedics called during one episode due to confusion; evaluated for possible TIA, no stroke found - Persistent fatigue since discharge; caregiver observes her falling asleep at the kitchen table, which is atypical - No ongoing confusion or lightheadedness - Hair loss and some depressive symptoms present - Denies feeling she has septic syndrome  Gastrointestinal Symptoms: - Lifelong history of constipation, predating opioid use - Currently experiencing loose, messy stools, similar to those during antibiotic use, with approximately two bowel movements per day - Concerned constipation will return once bowel habits normalize - Multiple over-the-counter remedies tried in the past for constipation (fiber supplements, milk of magnesia, Exlax) with little effect - No blood in  stool or melena  Medication History: - Currently takes Repatha for cholesterol management, but has not administered recently due to concerns about abdominal injections - Started hormone replacement therapy approximately one year ago - Remains in contact with infectious disease  physician for ongoing monitoring   Previous GI Procedures/Imaging   ERCP 01/08/2024 - No gross lesions in the distal esophagus.  - Erosive gastritis. Biopsied.  - No gross lesions in the duodenal bulb, in the first portion of the duodenum and in the second portion of the duodenum.  - The major papilla was on the rim of a diverticulum. The major papilla appeared to be small. Pus was seen in the major papilla.  - Filling defects consistent with stones and sludge was seen on the cholangiogram.  - The entire main bile duct was moderately dilated.  - Choledocholithiasis was found. Complete removal was accomplished by biliary sphincterotomy and ballon sphincteroplasty.  - Cholangitis persisted with decreased flow of bile so one plastic biliary stent was placed into the common bile duct to ensure patency.  Repeat ERCP 2 months for stent removal  Path: A. RANDOM GASTRIC BIOPSIES:  Chronic gastritis with lymphoid aggregates  Helicobacter stain negative ([facility], adequate control)  Negative for intestinal metaplasia, dysplasia and carcinoma   MRCP 01/07/2024 IMPRESSION: 1. Status post cholecystectomy. Choledocholithiasis with dilated common bile duct measuring up to 1.6 cm and stones up to 8 mm in the mid to distal duct. 2. 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  RUQ ultrasound 01/07/2024 IMPRESSION: 1. Previous cholecystectomy. Dilated biliary ductal tree with the common duct measuring 13.8 mm. Intrahepatic ductal dilatation present as well. Similar to prior recent examinations. This ductal dilatation could be due  to postsurgical dilatation in an elderly patient, but the possibility of a distal common duct obstruction is not excluded. 2. Heterogeneous echotexture of the liver probably related to steatosis.  Past Medical History:  Diagnosis Date   Asthma    Enterococcal bacteremia 01/24/2024   Hypothyroidism     Past Surgical History:  Procedure Laterality Date   BREAST REDUCTION SURGERY Bilateral benign tumors   CHOLECYSTECTOMY  in the 80s   ERCP N/A 01/08/2024   Procedure: ERCP, WITH INTERVENTION IF INDICATED;  Surgeon: Wilhelmenia Aloha Raddle., MD;  Location: [facility];  Service: Gastroenterology;  Laterality: N/A;  Day trip from [place] to [place] today 01/08/2024   TRANSESOPHAGEAL ECHOCARDIOGRAM (CATH LAB) N/A 01/10/2024   Procedure: TRANSESOPHAGEAL ECHOCARDIOGRAM;  Surgeon: Sheena Pugh, DO;  Location: [facility];  Service: Cardiovascular;  Laterality: N/A;    Current Outpatient Medications  Medication Sig Dispense Refill   ADVAIR DISKUS 250-50 MCG/DOSE AEPB INHALE 1 PUFF INTO THE LUNGS EVERY 12 HOURS NEED APPOINTMENT FOR FURTHER REFILLS (Patient taking differently: Inhale 1 puff into the lungs 2 (two) times daily as needed (shortness of breath, wheezing.).) 180 each 5   albuterol  (PROVENTIL  HFA;VENTOLIN  HFA) 108 (90 Base) MCG/ACT inhaler Inhale 1-2 puffs into the lungs every 6 (six) hours as needed for wheezing or shortness of breath.     aspirin EC 81 MG tablet Take 81 mg by mouth at bedtime.     denosumab (PROLIA) 60 MG/ML SOSY injection Inject 60 mg into the skin every 6 (six) months.     Evolocumab (REPATHA SURECLICK) 140 MG/ML SOAJ Inject 140 mg into the skin every 28 (twenty-eight) days.     fluconazole  (DIFLUCAN ) 150 MG tablet Take 1 tablet (150 mg total) by mouth daily. If having symptoms for yeast infection, can repeat in 5 days if still having symptoms 3 tablet 0   morphine  (MS CONTIN ) 30 MG 12 hr tablet Take 30 mg by mouth every 12 (twelve) hours.  nicotine   (NICODERM CQ  - DOSED IN MG/24 HOURS) 21 mg/24hr patch Place 1 patch (21 mg total) onto the skin daily. 28 patch 0   nicotine  (NICODERM CQ  - DOSED IN MG/24 HOURS) 21 mg/24hr patch Place 1 patch (21 mg total) onto the skin daily. 42 patch 0   Oxycodone  HCl 10 MG TABS Take 10 mg by mouth See admin instructions. Take 1 tablet (10mg ) by mouth three times a day: in the morning (with 1st morphine  dose), 1 tablet around noon, and 1 tablet in the afternoon around 1630(with 2nd morphine  dose). Take 1 additional tablet at bedtime as needed for breakthrough pain.  0   pantoprazole  (PROTONIX ) 40 MG tablet Take 1 tablet (40 mg total) by mouth daily. 30 tablet 3   progesterone  (PROMETRIUM ) 100 MG capsule Take 100 mg by mouth at bedtime.     thyroid  (ARMOUR) 15 MG tablet Take 15 mg by mouth daily. take in addition to a 60mg  tablet to equal total daily dose of 75mg .     thyroid  (ARMOUR) 60 MG tablet Take 60 mg by mouth daily. take in addition to a 15mg  tablet to equal total daily dose of 75mg .     triamterene-hydrochlorothiazide (MAXZIDE-25) 37.5-25 MG tablet Take 1 tablet by mouth daily as needed (lower extremity swelling).     No current facility-administered medications for this visit.    Allergies as of 02/28/2024 - Review Complete 02/28/2024  Allergen Reaction Noted   Iodinated contrast media Shortness Of Breath and Rash    Ery-tab [erythromycin] Nausea And Vomiting 04/20/2015   Statins Other (See Comments) 01/09/2024    Family History  Problem Relation Age of Onset   Asthma Brother        childhood   Asthma Father        adult onset   Hypertension Maternal Aunt    Congestive Heart Failure Maternal Grandfather    Hypertension Mother    Congestive Heart Failure Maternal Aunt     Social History[1]   Review of Systems:    Constitutional: No weight loss, fever, chills Cardiovascular: No chest pain Respiratory: No SOB  Gastrointestinal: See HPI and otherwise negative   Physical Exam:  Vital  signs: BP 122/80   Ht 5' 3 (1.6 m)   Wt 135 lb (61.2 kg)   BMI 23.91 kg/m   Wt Readings from Last 3 Encounters:  02/28/24 135 lb (61.2 kg)  01/24/24 132 lb 3.2 oz (60 kg)  01/19/24 130 lb (59 kg)     Constitutional: Pleasant, well-appearing female in NAD, alert and cooperative, anxious Head:  Normocephalic and atraumatic.  Respiratory: Respirations even and unlabored. Lungs clear to auscultation bilaterally.  No wheezes, crackles, or rhonchi.  Cardiovascular:  Regular rate and rhythm. No murmurs. No peripheral edema. Gastrointestinal:  Soft, nondistended, nontender. No rebound or guarding. Normal bowel sounds. No appreciable masses or hepatomegaly. Rectal:  Not performed.  Neurologic:  Alert and oriented x4;  grossly normal neurologically.  Skin:   Dry and intact without significant lesions or rashes. Psychiatric: Oriented to person, place and time. Demonstrates good judgement and reason without abnormal affect or behaviors.   TEE 01/10/2024 1. Left ventricular ejection fraction, by estimation, is 55 to 60% . The left ventricle has normal function.  2. Right ventricular systolic function is normal. The right ventricular size is normal.  3. No left atrial/ left atrial appendage thrombus was detected. The LAA emptying velocity was 44 cm/ s.  4. The mitral valve is abnormal. Mild to  moderate mitral valve regurgitation.  5. The aortic valve is normal in structure. There is mild calcification of the aortic valve. There is mild thickening of the aortic valve. Aortic valve regurgitation is trivial. Aortic valve sclerosis/ calcification is present, without any evidence of aortic stenosis.  6. 3D performed of the aortic valve.   Assessment/Plan:   Assessment & Plan Choledocholithiasis status post-ERCP with biliary stent placement Asymptomatic with normalized liver enzymes and bilirubin post-ERCP and stent placement.  Does have residual fatigue but overall feeling well, no abdominal pain  on exam today, vitals normal, recent labs show normal CBC, normal CMP, negative blood cultures.  Recommendation is for repeat ERCP for stent removal 2 months from last procedure.  Will discuss timing of this with Dr. Wilhelmenia.  - Labs today: CBC, CMP - Plan for repeat ERCP for biliary stent removal pending review by Dr. Wilhelmenia and will discuss with him whether any repeat imaging needed prior to procedure  Chronic constipation Longstanding refractory constipation, currently experiencing semiformed/loose but not watery stools likely due to recent antibiotics.  No rectal bleeding or melena.  Patient is concerned about recurrence of constipation for which she has previously not found a good remedy.  Discussed use of fiber supplements, MiraLAX , and if those are not effective trying prescription medication for constipation if needed, such as Linzess.  Plan for follow-up after ERCP and if patient has follow-up constipation can discuss further treatment options.     Camie Furbish, PA-C [facility] 02/28/2024, 10:03 AM  Patient Care Team: Clarice Nottingham, MD as PCP - General (Internal Medicine)       [1]  Social History Tobacco Use   Smoking status: Every Day    Current packs/day: 0.75    Average packs/day: 0.8 packs/day for 48.0 years (36.0 ttl pk-yrs)    Types: Cigarettes   Smokeless tobacco: Never   Tobacco comments:    typically smokes between 1/4 and 1/2 ppd (04/20/15)  Vaping Use   Vaping status: Former  Substance Use Topics   Alcohol use: No    Alcohol/week: 0.0 standard drinks of alcohol   Drug use: No   "

## 2024-02-28 NOTE — Patient Instructions (Signed)
 Please go to the lab in the basement of our building to have lab work done as you leave today. Hit B for basement when you get on the elevator.  When the doors open the lab is on your left.  We will call you with the results. Thank you.  _______________________________________________________  If your blood pressure at your visit was 140/90 or greater, please contact your primary care physician to follow up on this.  _______________________________________________________  If you are age 75 or older, your body mass index should be between 23-30. Your Body mass index is 23.91 kg/m. If this is out of the aforementioned range listed, please consider follow up with your Primary Care Provider.  If you are age 30 or younger, your body mass index should be between 19-25. Your Body mass index is 23.91 kg/m. If this is out of the aformentioned range listed, please consider follow up with your Primary Care Provider.   ________________________________________________________  The [facility] providers would like to encourage you to use MYCHART to communicate with providers for non-urgent requests or questions.  Due to long hold times on the telephone, sending your provider a message by [facility] may be a faster and more efficient way to get a response.  Please allow 48 business hours for a response.  Please remember that this is for non-urgent requests.  _______________________________________________________  [facility] is using a team-based approach to care.  Your team is made up of your doctor and two to three APPS. Our APPS (Nurse Practitioners and Physician Assistants) work with your physician to ensure care continuity for you. They are fully qualified to address your health concerns and develop a treatment plan. They communicate directly with your gastroenterologist to care for you. Seeing the Advanced Practice Practitioners on your physician's team can help you by facilitating care more  promptly, often allowing for earlier appointments, access to diagnostic testing, procedures, and other specialty referrals.   Due to recent changes in healthcare laws, you may see the results of your imaging and laboratory studies on MyChart before your provider has had a chance to review them.  We understand that in some cases there may be results that are confusing or concerning to you. Not all laboratory results come back in the same time frame and the provider may be waiting for multiple results in order to interpret others.  Please give us  48 hours in order for your provider to thoroughly review all the results before contacting the office for clarification of your results.

## 2024-02-29 NOTE — Progress Notes (Signed)
"   Attending Physician's Attestation   I have reviewed the chart.   I agree with the Advanced Practitioner's note, impression, and recommendations with any updates as below. Reasonable time within 2 to 3 months of ERCP for repeat ERCP and stent removal.  She should be on our recall list and will be called when able to set up based on scheduling.   Aloha Finner, MD  Gastroenterology [facility] Office # [phone]  "

## 2024-03-01 ENCOUNTER — Other Ambulatory Visit: Payer: Self-pay | Admitting: Infectious Disease

## 2024-03-01 NOTE — Telephone Encounter (Signed)
 I received a call from [place] that Manele passed out on the couch at rest.  Her labs done with GI were normal  I had recommended trip to ER but they decided against this  I told them I would put in referral by Cardiology and I can order a CT scan but before she gets a CT scan I think she should be seen to see how stable she is  If she can't be seen by one of my partners, maybe I can create a spot next week  I think she should also see her PCP

## 2024-03-01 NOTE — Progress Notes (Signed)
 March 2026 recall for ERCP is entered in EPIC.

## 2024-03-01 NOTE — Progress Notes (Unsigned)
 I got called by Molly Myers last night around 620 or so.  Donny is again passing out, this time while on the couch at rest  I recommended trip to ER but they did not want to go  I think she needs to be seen by Cardiology for arrhythmia work-up  I am also going to order a repeat CT abdomen and pelvis

## 2024-03-06 ENCOUNTER — Ambulatory Visit: Admitting: Cardiology

## 2024-03-06 ENCOUNTER — Encounter: Payer: Self-pay | Admitting: Cardiology

## 2024-03-06 VITALS — BP 126/70 | HR 90 | Ht 63.0 in | Wt 136.4 lb

## 2024-03-06 DIAGNOSIS — I872 Venous insufficiency (chronic) (peripheral): Secondary | ICD-10-CM | POA: Diagnosis present

## 2024-03-06 DIAGNOSIS — A499 Bacterial infection, unspecified: Secondary | ICD-10-CM | POA: Insufficient documentation

## 2024-03-06 DIAGNOSIS — B952 Enterococcus as the cause of diseases classified elsewhere: Secondary | ICD-10-CM | POA: Diagnosis present

## 2024-03-06 DIAGNOSIS — I359 Nonrheumatic aortic valve disorder, unspecified: Secondary | ICD-10-CM | POA: Diagnosis present

## 2024-03-06 DIAGNOSIS — R931 Abnormal findings on diagnostic imaging of heart and coronary circulation: Secondary | ICD-10-CM | POA: Diagnosis present

## 2024-03-06 DIAGNOSIS — I340 Nonrheumatic mitral (valve) insufficiency: Secondary | ICD-10-CM | POA: Insufficient documentation

## 2024-03-06 DIAGNOSIS — R7881 Bacteremia: Secondary | ICD-10-CM | POA: Insufficient documentation

## 2024-03-06 DIAGNOSIS — K8050 Calculus of bile duct without cholangitis or cholecystitis without obstruction: Secondary | ICD-10-CM | POA: Diagnosis present

## 2024-03-06 NOTE — Progress Notes (Unsigned)
 " Cardiology Office Note:  .   Date:  03/07/2024  ID:  Molly Myers, DOB Feb 04, 1951, MRN [id number] PCP: Clarice Nottingham, MD  [facility] Providers Cardiologist:  Alm Clay, MD     Chief Complaint  Patient presents with   Hospitalization Follow-up   New Patient (Initial Visit)    Baseline follow-up after hospitalization abnormal echocardiogram and near syncope.    Patient Profile: .     Molly Myers is a 74 y.o. female with a PMH notable for hypothyroidism who presents here for Cardiology Evaluation of Syncope (that occurred in the setting of sepsis) & Valvular Heart Disease at the request of Clarice Nottingham, MD.  Brief History: Hospital Admission (01/07/2024 -01/11/2024): Presented with fever, leukocytosis with evidence of cholangitis. = Initial symptoms altered mental status with near syncope and lethargy prompting her call to 911.  She was minimally responsive to verbal stimuli.  Noted to be tachycardic and hypoxic with sats of 90 to 91%.  She had a low-grade temp of 99.4.  Oxygen levels on her to 96% on 2 L.  She was given 30 mL LR by EMS.  In the ER she noted right lower chest wall pain with inspiration and chronic low back pain.  Chest x-ray suggested right lower lobe pneumonia.  Lactate was 2.7.  Code sepsis protocol initiated -> treated with Maxipime , Vanco and Flagyl .  Apparently she had also had nausea vomiting.  As a result of transaminitis, she was evaluated for cholangitis initially with MRCP followed by ERCP.  ( MRCP with choledocholithiasis and dilated common bile duct 1.6 cm; ERCP with choledocholithiasis, complete removal by biliary sphincterotomy and balloon sphincteroplasty-biliary stent placed, purulent drainage-stent removal in 2 months that GI will schedule) => with treatment, LFTs improved and normalized.  She was found to be bacteremic (Blood cultures 11/30 E. coli, Enterococcus faecium => follow-up blood cultures 12/1 NGTD). TTE 12/2, suspected aortic  calcification versus vegetation, but TEE 12/3, no evidence of aortic vegetation. => She is clinically improving on discharge and was converted to ciprofloxacin  plus amoxicillin  to complete 21 days of therapy.  Sharren Schnurr was last seen on 01/24/2024 by Dr. Fleeta Rothman from infectious disease.  She noted profound fatigue which is new since changing her antibiotics..  Follow-up CBC and CMP ordered along with blood cultures and GI follow-up recommended.  Because of the initial presenting symptoms a being near syncope along with the valvular findings on echo, she was referred to cardiology      Subjective  Discussed the use of AI scribe software for clinical note transcription with the patient, who gave verbal consent to proceed.  History of Present Illness Molly Myers is a 74 year old female who presents with a history of passing out and recent hospitalization for sepsis. She is accompanied by Lorrene, her companion. She was referred by Dr. Van Damme for evaluation after a passing out episode and recent sepsis.  On January 07, 2024, she experienced a sudden fall from a kitchen chair, initially thought to be a passing out episode, but she was aware as she was going down. This incident led to a hospital admission for sepsis. Since her hospital discharge, she has not experienced any further passing out episodes. She experiences occasional sensations of 'skippy beats' but has no significant chest pain, shortness of breath, or irregular heartbeats.  During her hospital stay, she was diagnosed with sepsis due to cholangitis and gallstones, with 14 stones identified. An ERCP was performed, and a stent was placed  to aid drainage. She had E. coli and Enterococcus faecium in her bloodstream.  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 A transesophageal echocardiogram showed mild calcium on the aortic valve.  She has a history of  hypothyroidism, which became hyperthyroidism during a stressful period while caring for her mother with dementia. She was treated with methimazole but had to discontinue it due to adverse effects.  She has a history of coronary artery disease with a coronary calcium score of 681 from April 2023. She has been on Repatha for cholesterol management for about two to three years but is hesitant to continue due to concerns about her liver after her recent illness.  She experiences swelling in her left leg, which she attributes to venous stasis following vein procedures. No swelling in other areas or need for extra pillows at night.  Cardiovascular ROS: positive for - dyspnea on exertion, edema, irregular heartbeat, palpitations, and much exertional dyspnea and is related to deconditioning from her hospitalization.  Edema is related to chronic venous insufficiency.  More unilateral. negative for - chest pain, loss of consciousness, orthopnea, paroxysmal nocturnal dyspnea, rapid heart rate, shortness of breath, or TIA/amaurosis fugax or claudication.  Melena, hematochezia, hematuria or epistaxis.  ROS:  Review of Systems - Negative except gradually recovering from her hospitalization.  Somewhat deconditioned and weak.    Objective   Past Medical History:  Diagnosis Date   Asthma    Enterococcal bacteremia 01/24/2024   Hypothyroidism    Past Surgical History:  Procedure Laterality Date   BREAST REDUCTION SURGERY Bilateral benign tumors   CHOLECYSTECTOMY  in the 80s   ERCP N/A 01/08/2024   Procedure: ERCP, WITH INTERVENTION IF INDICATED;  Surgeon: Wilhelmenia Aloha Raddle., MD;  Location: [facility];  Service: Gastroenterology;  Laterality: N/A;  Day trip from [place] to [place] today 01/08/2024   TRANSESOPHAGEAL ECHOCARDIOGRAM (CATH LAB) N/A 01/10/2024   Procedure: TRANSESOPHAGEAL ECHOCARDIOGRAM;  Surgeon: Sheena Pugh, DO;  Location: [facility];  Service: Cardiovascular;  Laterality:  N/A;   Social History  reports that she has been smoking cigarettes. She has a 36 pack-year smoking history. She has never used smokeless tobacco. She reports that she does not drink alcohol and does not use drugs.   Family family history includes Asthma in her brother and father; Congestive Heart Failure in her maternal aunt and maternal grandfather; Hypertension in her maternal aunt and mother.  Medications:-Pertinent CV medications include aspirin 81 mg daily, Repatha 140 mg which she has not started back since her hospitalization due to concerns for lipids. Active Medications[1]   Studies Reviewed: SABRA   EKG Interpretation Date/Time:  Wednesday March 06 2024 16:19:43 EST Ventricular Rate:  82 PR Interval:  166 QRS Duration:  86 QT Interval:  366 QTC Calculation: 427 R Axis:   1  Text Interpretation: Normal sinus rhythm Normal ECG When compared with ECG of 07-Jan-2024 10:10, PREVIOUS ECG IS PRESENT Confirmed by Anner Lenis ([id number]) on 03/06/2024 4:57:06 PM    No results found for: CHOL, HDL, LDLCALC, LDLDIRECT, TRIG, CHOLHDL Lab Results  Component Value Date   NA 137 02/28/2024   K 3.9 02/28/2024   CREATININE 0.89 02/28/2024   GFR 63.70 02/28/2024   GLUCOSE 97 02/28/2024   Results Labs Blood culture (01/2024): Escherichia coli and Enterococcus faecium isolated HbA1c (01/2024): 5.9 Comprehensive metabolic panel (01/2024): Low calcium; low protein; creatinine 0.86; normal kidney function  Diagnostic  Transthoracic Echocardiogram (01/09/2024): Left ventricular ejection fraction 60-65%; normal systolic function; no wall motion abnormalities; mobile mass on aortic valve measuring 0.5 x 0.25 cm; aortic valve calcification; elevated right atrial pressure; mild to moderate mitral regurgitation Transesophageal Echocardiogram (01/2024): Normal left ventricular systolic function; mild to moderate mitral regurgitation; mild aortic valve calcification; no significant  valvular vegetation Coronary Artery Calcium Score (05/2021): 681   Risk Assessment/Calculations:              Physical Exam:   VS:  BP 126/70 (BP Location: Left Arm, Patient Position: Sitting, Cuff Size: Normal)   Pulse 90   Ht 5' 3 (1.6 m)   Wt 136 lb 6.4 oz (61.9 kg)   SpO2 94%   BMI 24.16 kg/m    Wt Readings from Last 3 Encounters:  03/06/24 136 lb 6.4 oz (61.9 kg)  02/28/24 135 lb (61.2 kg)  01/24/24 132 lb 3.2 oz (60 kg)    GEN: Well nourished, well groomed; in no acute distress; relatively healthy appearing.  Mildly obese NECK: No JVD; No carotid bruits CARDIAC: RRR,; Normal S1, S2; soft 1/6 SEM at RUSB murmurs, rubs, gallops RESPIRATORY:  Clear to auscultation without rales, wheezing or rhonchi ; nonlabored, good air movement. ABDOMEN: Soft, non-tender, non-distended EXTREMITIES: 2+ pitting edema in the left lower extremity with trivial edema right edema; No deformity     ASSESSMENT AND PLAN: .   Enterococcal bacteremia Evaluated with TTE followed by TEE.  No signs of endocarditis.  No symptoms to suggest endocarditis with prolonged fever or chills not explained otherwise.  Cultures negative after antibiotics.  Mitral regurgitation Mild to moderate mitral regurgitation noted, likely mild and not significant. No symptoms of heart failure or significant valve dysfunction. - Reassess with echocardiogram in 6 months when healthy.  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 No significant valve dysfunction or infection. TEE confirmed no significant abnormalities. - Reassess with echocardiogram in 6 months.  Chronic venous insufficiency of lower extremity Chronic venous insufficiency of Left   Right lower extremity Chronic venous insufficiency with pitting edema in the left lower extremity, likely related to venous stasis. - Recommended wearing sports socks.  Would avoid diuretic for now.  Elevated coronary  artery calcium score Coronary calcium score of 681 indicates cholesterol plaque. No symptoms of chest pain or significant coronary artery disease. Normal pump function and no significant wall motion abnormalities. On Repatha for cholesterol management, with concerns about liver impact due to gallstones. - Continue Repatha for cholesterol management. - Ensure cholesterol panel is completed as ordered by Dr. Clarice. - Continue aspirin therapy.   Orders Placed This Encounter  Procedures   EKG 12-Lead   ECHOCARDIOGRAM COMPLETE    No orders of the defined types were placed in this encounter.         Follow-Up: Return in about 6 months (around 09/03/2024) for [facility], Routine follow up with me, To discuss test results.   Portions of this note were dictated using DRAGON voice recognition software. Please disregard any errors in transcription. This record has been created using Conservation officer, historic buildings. Errors have been sought and corrected, but may not always be located. Such creation errors do not reflect on the standard of medical care.      Signed, Alm MICAEL Clay, MD, MS Alm Clay, M.D., M.S. Interventional Cardiologist  [facility] Pager # [phone]         [1]  Current Meds  Medication Sig   ADVAIR DISKUS 250-50 MCG/DOSE AEPB INHALE 1 PUFF INTO THE LUNGS EVERY 12 HOURS NEED APPOINTMENT FOR FURTHER REFILLS (Patient taking differently: Inhale 1 puff into the lungs 2 (two) times daily as needed (shortness of breath, wheezing.).)   albuterol  (PROVENTIL  HFA;VENTOLIN  HFA) 108 (90 Base) MCG/ACT inhaler Inhale 1-2 puffs into the lungs every 6 (six) hours as needed for wheezing or shortness of breath.   aspirin EC 81 MG tablet Take 81 mg by mouth at bedtime.   denosumab (PROLIA) 60 MG/ML SOSY injection Inject 60 mg into the skin every 6 (six) months.   Evolocumab (REPATHA SURECLICK) 140 MG/ML SOAJ Inject 140 mg into the skin every 28  (twenty-eight) days.   fluconazole  (DIFLUCAN ) 150 MG tablet Take 1 tablet (150 mg total) by mouth daily. If having symptoms for yeast infection, can repeat in 5 days if still having symptoms   morphine  (MS CONTIN ) 30 MG 12 hr tablet Take 30 mg by mouth every 12 (twelve) hours.   nicotine  (NICODERM CQ  - DOSED IN MG/24 HOURS) 21 mg/24hr patch Place 1 patch (21 mg total) onto the skin daily.   nicotine  (NICODERM CQ  - DOSED IN MG/24 HOURS) 21 mg/24hr patch Place 1 patch (21 mg total) onto the skin daily.   Oxycodone  HCl 10 MG TABS Take 10 mg by mouth See admin instructions. Take 1 tablet (10mg ) by mouth three times a day: in the morning (with 1st morphine  dose), 1 tablet around noon, and 1 tablet in the afternoon around 1630(with 2nd morphine  dose). Take 1 additional tablet at bedtime as needed for breakthrough pain.   pantoprazole  (PROTONIX ) 40 MG tablet Take 1 tablet (40 mg total) by mouth daily.   progesterone  (PROMETRIUM ) 100 MG capsule Take 100 mg by mouth at bedtime.   thyroid  (ARMOUR) 15 MG tablet Take 15 mg by mouth daily. take in addition to a 60mg  tablet to equal total daily dose of 75mg .   thyroid  (ARMOUR) 60 MG tablet Take 60 mg by mouth daily. take in addition to a 15mg  tablet to equal total daily dose of 75mg .   triamterene-hydrochlorothiazide (MAXZIDE-25) 37.5-25 MG tablet Take 1 tablet by mouth daily as needed (lower extremity swelling).   "

## 2024-03-06 NOTE — Patient Instructions (Addendum)
 Medication Instructions:   No changes    Lab Work: Not needed    Testing/Procedures: Schedule in 6 months  - June 2026 Your physician has requested that you have an echocardiogram. Echocardiography is a painless test that uses sound waves to create images of your heart. It provides your doctor with information about the size and shape of your heart and how well your hearts chambers and valves are working. This procedure takes approximately one hour. There are no restrictions for this procedure. Please do NOT wear cologne, perfume, aftershave, or lotions (deodorant is allowed). Please arrive 15 minutes prior to your appointment time.  Please note: We ask at that you not bring children with you during ultrasound (echo/ vascular) testing. Due to room size and safety concerns, children are not allowed in the ultrasound rooms during exams. Our front office staff cannot provide observation of children in our lobby area while testing is being conducted. An adult accompanying a patient to their appointment will only be allowed in the ultrasound room at the discretion of the ultrasound technician under special circumstances. We apologize for any inconvenience.   Follow-Up: At [facility], you and your health needs are our priority.  As part of our continuing mission to provide you with exceptional heart care, we have created designated Provider Care Teams.  These Care Teams include your primary Cardiologist (physician) and Advanced Practice Providers (APPs -  Physician Assistants and Nurse Practitioners) who all work together to provide you with the care you need, when you need it.     Your next appointment:   6 month(s) after echo   The format for your next appointment:   In Person  Provider:   Alm Clay, MD

## 2024-03-07 ENCOUNTER — Encounter: Payer: Self-pay | Admitting: Cardiology

## 2024-03-07 DIAGNOSIS — I872 Venous insufficiency (chronic) (peripheral): Secondary | ICD-10-CM | POA: Insufficient documentation

## 2024-03-07 DIAGNOSIS — I340 Nonrheumatic mitral (valve) insufficiency: Secondary | ICD-10-CM | POA: Insufficient documentation

## 2024-03-07 DIAGNOSIS — I359 Nonrheumatic aortic valve disorder, unspecified: Secondary | ICD-10-CM | POA: Insufficient documentation

## 2024-03-07 DIAGNOSIS — R931 Abnormal findings on diagnostic imaging of heart and coronary circulation: Secondary | ICD-10-CM | POA: Insufficient documentation

## 2024-03-07 NOTE — Assessment & Plan Note (Signed)
 Mild to moderate mitral regurgitation noted, likely mild and not significant. No symptoms of heart failure or significant valve dysfunction. - Reassess with echocardiogram in 6 months when healthy.

## 2024-03-07 NOTE — Assessment & Plan Note (Addendum)
 Coronary calcium score of 681 indicates cholesterol plaque. No symptoms of chest pain or significant coronary artery disease. Normal pump function and no significant wall motion abnormalities. On Repatha for cholesterol management, with concerns about liver impact due to gallstones. - Continue Repatha for cholesterol management. - Ensure cholesterol panel is completed as ordered by Dr. Clarice. - Continue aspirin therapy.

## 2024-03-07 NOTE — Assessment & Plan Note (Signed)
 Chronic venous insufficiency of Left   Right lower extremity Chronic venous insufficiency with pitting edema in the left lower extremity, likely related to venous stasis. - Recommended wearing sports socks.  Would avoid diuretic for now.

## 2024-03-07 NOTE — Assessment & Plan Note (Signed)
 Evaluated with TTE followed by TEE.  No signs of endocarditis.  No symptoms to suggest endocarditis with prolonged fever or chills not explained otherwise.  Cultures negative after antibiotics.

## 2024-03-07 NOTE — Assessment & Plan Note (Signed)
 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 No significant valve dysfunction or infection. TEE confirmed no significant abnormalities. - Reassess with echocardiogram in 6 months.

## 2024-03-11 ENCOUNTER — Ambulatory Visit: Payer: Self-pay | Admitting: Internal Medicine

## 2024-03-11 ENCOUNTER — Ambulatory Visit: Admitting: Pulmonary Disease

## 2024-03-12 ENCOUNTER — Ambulatory Visit: Payer: Self-pay | Admitting: Internal Medicine

## 2024-03-13 ENCOUNTER — Ambulatory Visit: Admitting: Internal Medicine

## 2024-03-28 ENCOUNTER — Ambulatory Visit: Admitting: Pulmonary Disease

## 2024-08-20 ENCOUNTER — Ambulatory Visit (HOSPITAL_COMMUNITY)
